# Patient Record
Sex: Female | Born: 1990 | Race: White | Hispanic: No | Marital: Single | Smoking: Never smoker
Health system: Southern US, Community
[De-identification: ages and names within clinical notes are randomized; demographics above are authoritative.]

## PROBLEM LIST (undated history)

## (undated) DIAGNOSIS — L509 Urticaria, unspecified: Secondary | ICD-10-CM

## (undated) DIAGNOSIS — L709 Acne, unspecified: Secondary | ICD-10-CM

## (undated) HISTORY — PX: TONSILLECTOMY: SHX5217

## (undated) HISTORY — DX: Acne, unspecified: L70.9

## (undated) HISTORY — DX: Urticaria, unspecified: L50.9

---
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
## 2011-02-01 NOTE — Op Note (Signed)
NAMEDESHONNA, TRNKA                              ACCOUNT NO.:  [id number]   MEDICAL RECORD NO.:  [id number]                   PATIENT TYPE:  AMB   LOCATION:  DSC                                  FACILITY:  [facility]   PHYSICIAN:  Christopher E. Ezzard Standing, M.D.         DATE OF BIRTH:  12-25-1990   DATE OF PROCEDURE:  08/05/2003  DATE OF DISCHARGE:                                 OPERATIVE REPORT   PREOPERATIVE DIAGNOSES:  Chronic tonsillitis with tonsillar hypertrophy.  Nasal obstruction with turbinate hypertrophy.   POSTOPERATIVE DIAGNOSES:  Chronic tonsillitis with tonsillar hypertrophy.  Nasal obstruction with turbinate hypertrophy.   OPERATION:  Tonsillectomy and adenoidectomy.  Bilateral inferior turbinate  reductions with submucosal coblation.   SURGEON:  Kristine Garbe. Ezzard Standing, M.D.   ANESTHESIA:  General endotracheal.   COMPLICATIONS:  None.   BRIEF CLINICAL NOTE:  Michelle Brown is an 21 year old who has had frequent upper  respiratory infections as well as chronic sore throats.  On examination, she  has moderately large embedded tonsils which are very cryptic bilaterally, 2-  3+.  On nasal exam she has large swollen inferior turbinates with swollen  nasal mucosa consistent with rhinitis and turbinate hypertrophy.  She was  taken to the operating room at this time for tonsillectomy, adenoidectomy,  and turbinate reductions.   DESCRIPTION OF PROCEDURE:  After adequate endotracheal anesthesia, the  patient received 8 mg of Decadron IV preoperatively as well as 1 g Ancef IV  preoperatively.  Mouth gag was used to expose the oropharynx.  Using the  coblator, the tonsils were resected from the tonsil fossae bilaterally.  She  had deep, embedded tonsil bilaterally.  Hemostasis was obtained with the  coblator.  Care was taken to preserve the uvula, anterior and posterior  tonsillar pillars.  Following this, a red rubber catheter was passed through  the nose and out the mouth to retract soft  palate and nasopharynx was  examined.  [name] had just small, average-sized adenoid tissue.  The minimal  amount of adenoid tissue was coblated.   Following this, the nose was examined.  [name] had large inferior turbinates  bilaterally.  Again using the coblator--the turbinates were injected with  Xylocaine with epinephrine--and the coblator was passed anteriorly with  coblation set at 5 for ten seconds and then a second posterior lesion set at  5 for ten seconds and then a third lesion anteriorly-superiorly again set at  5 for ten seconds.  The procedure was repeated on the right side, again  using coblator and producing submucosal coblation at three different sites  for ten seconds.   [name] tolerated this well and was subsequently awoken from anesthesia and  transferred to the recovery room postoperatively doing well.   DISPOSITION:  Michelle Brown will be discharged home later this morning on amoxicillin  suspension 400 mg b.i.d. for one week, Tylenol, Lortab elixir 10-15 cc q.5h.  p.r.n. pain.  Will have  her follow up in my office in two weeks for recheck.                                               Kristine Garbe. Ezzard Standing, M.D.    CEN/MEDQ  D:  08/05/2003  T:  08/06/2003  Job:  [id number]   cc:   Luz Brazen, M.D.

## 2011-04-24 ENCOUNTER — Telehealth: Payer: Self-pay | Admitting: Family Medicine

## 2011-04-24 NOTE — Telephone Encounter (Signed)
My schedule is beyond full and I need to be somewhere at 1p--- I can see her tomorrow---or if it is more urgent than that --- we can scheule her with another Dr.

## 2011-04-24 NOTE — Telephone Encounter (Signed)
Spoke with Pt mom appt scheduled.

## 2011-04-25 ENCOUNTER — Encounter: Payer: Self-pay | Admitting: Family Medicine

## 2011-04-25 ENCOUNTER — Ambulatory Visit (INDEPENDENT_AMBULATORY_CARE_PROVIDER_SITE_OTHER): Payer: BC Managed Care – PPO | Admitting: Family Medicine

## 2011-04-25 VITALS — BP 90/60 | HR 88 | Temp 98.4°F | Wt 164.0 lb

## 2011-04-25 DIAGNOSIS — F988 Other specified behavioral and emotional disorders with onset usually occurring in childhood and adolescence: Secondary | ICD-10-CM

## 2011-04-25 DIAGNOSIS — R42 Dizziness and giddiness: Secondary | ICD-10-CM

## 2011-04-25 DIAGNOSIS — J329 Chronic sinusitis, unspecified: Secondary | ICD-10-CM

## 2011-04-25 DIAGNOSIS — D649 Anemia, unspecified: Secondary | ICD-10-CM

## 2011-04-25 LAB — HEPATIC FUNCTION PANEL
Albumin: 4.4 g/dL (ref 3.5–5.2)
Bilirubin, Direct: 0.1 mg/dL (ref 0.0–0.3)
Total Bilirubin: 0.8 mg/dL (ref 0.3–1.2)
Total Protein: 7.4 g/dL (ref 6.0–8.3)

## 2011-04-25 LAB — CBC WITH DIFFERENTIAL/PLATELET
Eosinophils Absolute: 0.1 10*3/uL (ref 0.0–0.7)
Eosinophils Relative: 2.4 % (ref 0.0–5.0)
HCT: 39.1 % (ref 36.0–46.0)
Hemoglobin: 13.1 g/dL (ref 12.0–15.0)
Lymphs Abs: 1.6 10*3/uL (ref 0.7–4.0)
MCHC: 33.4 g/dL (ref 30.0–36.0)
MCV: 90 fl (ref 78.0–100.0)
Neutro Abs: 2 10*3/uL (ref 1.4–7.7)
Neutrophils Relative %: 49.9 % (ref 43.0–77.0)
RBC: 4.35 Mil/uL (ref 3.87–5.11)

## 2011-04-25 LAB — IBC PANEL: Transferrin: 301.3 mg/dL (ref 212.0–360.0)

## 2011-04-25 LAB — BASIC METABOLIC PANEL
CO2: 27 mEq/L (ref 19–32)
Chloride: 102 mEq/L (ref 96–112)
GFR: 99.05 mL/min (ref >60.00)
Glucose, Bld: 92 mg/dL (ref 70–99)

## 2011-04-25 LAB — FERRITIN: Ferritin: 17.9 ng/mL (ref 10.0–291.0)

## 2011-04-25 MED ORDER — CEFUROXIME AXETIL 500 MG PO TABS: 500.0000 mg | ORAL_TABLET | Freq: Two times a day (BID) | ORAL | Status: AC

## 2011-04-25 MED ORDER — METHYLPHENIDATE HCL 20 MG PO TABS: ORAL_TABLET | ORAL | Status: DC

## 2011-04-25 NOTE — Patient Instructions (Signed)

## 2011-04-25 NOTE — Progress Notes (Signed)
Subjective:    Patient ID: Michelle Brown, female    DOB: 1991-08-15, 20 y.o.   MRN: [id number]  HPI    Review of Systems     Objective:   Physical Exam        Assessment & Plan:   Subjective:    Michelle Brown is a 20 y.o. female who presents for evaluation of headache. Symptoms began about 6 days ago. Generally, the headaches last about all day for 6 days and occur continuously. The headaches are better if lying down and worse during day. The headaches are usually throbbing and are located in front of head and neck.  The patient rates her most severe headaches a 4 on a scale from 1 to 10. Recently, the headaches have been stable. Work attendance or other daily activities are not affected by the headaches. Precipitating factors include: none which have been determined. The headaches are usually not preceded by an aura. Associated neurologic symptoms: decreased physical activity, dizziness and vision problems. The patient denies depression, loss of balance, muscle weakness, numbness of extremities, speech difficulties, vomiting in the early morning and worsening school/work performance. Home treatment has included ibuprofen, resting and sleeping with no improvement. Other history includes:   The following portions of the patient's history were reviewed and updated as appropriate: allergies, current medications, past family history, past medical history, past social history, past surgical history and problem list.  Review of Systems Pertinent items are noted in HPI.    Objective:    BP 90/60  Pulse 88  Temp(Src) 98.4 F (36.9 C) (Oral)  Wt 164 lb (74.39 kg)  SpO2 97% General appearance: alert, cooperative, appears stated age and mildly obese Ears: normal TM's and external ear canals both ears Nose: Nares normal. Septum midline. Mucosa normal. No drainage or sinus tenderness., green discharge, mild congestion, turbinates red, swollen, sinus tenderness bilateral Throat: lips, mucosa, and  tongue normal; teeth and gums normal Lungs: clear to auscultation bilaterally Heart: regular rate and rhythm, S1, S2 normal, no murmur, click, rub or gallop Neurologic: Grossly normal   Assessment:        Plan:     Subjective:     Michelle Brown is a 20 y.o. female who presents for evaluation of sinus pain. Symptoms include: congestion, headaches, nasal congestion and post nasal drip. Onset of symptoms was 5 days ago. Symptoms have been unchanged since that time. Past history is significant for no history of pneumonia or bronchitis. Patient is a non-smoker.  The following portions of the patient's history were reviewed and updated as appropriate: allergies, current medications, past family history, past medical history, past social history, past surgical history and problem list.  Review of Systems Pertinent items are noted in HPI.   Objective:    BP 90/60  Pulse 88  Temp(Src) 98.4 F (36.9 C) (Oral)  Wt 164 lb (74.39 kg)  SpO2 97% General appearance: alert, cooperative, appears stated age and no distress Ears: normal TM's and external ear canals both ears Nose: Nares normal. Septum midline. Mucosa normal. No drainage or sinus tenderness., mild congestion, sinus tenderness bilateral Throat: lips, mucosa, and tongue normal; teeth and gums normal Neck: no adenopathy, no carotid bruit, no JVD, supple, symmetrical, trachea midline and thyroid not enlarged, symmetric, no tenderness/mass/nodules Lungs: clear to auscultation bilaterally Heart: regular rate and rhythm, S1, S2 normal, no murmur, click, rub or gallop Extremities: extremities normal, atraumatic, no cyanosis or edema    Assessment:    Acute bacterial sinusitis.  Plan:    Nasal steroids per medication orders. Ceftin per medication orders. Follow up in 2 days or as needed.

## 2011-04-26 ENCOUNTER — Telehealth: Payer: Self-pay | Admitting: *Deleted

## 2011-04-26 NOTE — Telephone Encounter (Signed)
Discuss with patient  

## 2011-04-26 NOTE — Telephone Encounter (Signed)
Pt is not anemic Rest of labs normal Left message for pt to call back.

## 2011-04-26 NOTE — Telephone Encounter (Signed)
Error

## 2011-08-12 ENCOUNTER — Encounter: Payer: Self-pay | Admitting: Family

## 2011-08-12 ENCOUNTER — Ambulatory Visit (INDEPENDENT_AMBULATORY_CARE_PROVIDER_SITE_OTHER): Payer: BC Managed Care – PPO | Admitting: Family

## 2011-08-12 VITALS — BP 108/72 | HR 82 | Temp 98.2°F | Resp 16 | Wt 154.1 lb

## 2011-08-12 DIAGNOSIS — J019 Acute sinusitis, unspecified: Secondary | ICD-10-CM

## 2011-08-12 DIAGNOSIS — J029 Acute pharyngitis, unspecified: Secondary | ICD-10-CM

## 2011-08-12 MED ORDER — AMOXICILLIN-POT CLAVULANATE 875-125 MG PO TABS: 1.0000 | ORAL_TABLET | Freq: Two times a day (BID) | ORAL | Status: AC

## 2011-08-12 NOTE — Patient Instructions (Signed)
Please complete your lab work prior to leaving today. Use back up birth control while on Augmentin and for 1-2 weeks after.  Call if symptoms worsen or if you are not feeling better in 2-3 days.

## 2011-08-12 NOTE — Progress Notes (Signed)
  Subjective:    Patient ID: Michelle Brown, female    DOB: 08-19-1991, 20 y.o.   MRN: [id number]  HPI  Ms.  Michelle Brown is a 20 yr old female who presents today with chief complaint of sinus congestion and headache.  She reports associated green nasal discharge. She has associated sore throat.  She notes occasional green discharge.  Symptoms started 2 weeks ago and are waxing and waning in severity.  Has associated headache. She has tried tyleonl, advil and sudafed without improvement.   Review of Systems    see HPI  Past Medical History  Diagnosis Date  . Acne     History   Social History  . Marital Status: Single    Spouse Name: N/A    Number of Children: N/A  . Years of Education: N/A   Occupational History  . Not on file.   Social History Main Topics  . Smoking status: Never Smoker   . Smokeless tobacco: Not on file  . Alcohol Use: No  . Drug Use: No  . Sexually Active: Not on file   Other Topics Concern  . Not on file   Social History Narrative  . No narrative on file    Past Surgical History  Procedure Date  . Tonsillectomy     Family History  Problem Relation Age of Onset  . Hypertension    . Leukemia    . Pancreatic cancer      No Known Allergies  Current Outpatient Prescriptions on File Prior to Visit  Medication Sig Dispense Refill  . METRONIDAZOLE, TOPICAL, 0.75 % LOTN       . Norethin Ace-Eth Estrad-FE (LOESTRIN 24 FE PO) Take 1 tablet by mouth daily.          BP 108/72  Pulse 82  Temp(Src) 98.2 F (36.8 C) (Oral)  Resp 16  Wt 154 lb 1.3 oz (69.89 kg)  SpO2 98%  LMP 06/17/2011    Objective:   Physical Exam  Constitutional: She appears well-developed and well-nourished.  HENT:  Head: Normocephalic and atraumatic.  Right Ear: Tympanic membrane and ear canal normal.  Left Ear: Tympanic membrane and ear canal normal.  Mouth/Throat: Uvula is midline and mucous membranes are normal. No oropharyngeal exudate, posterior oropharyngeal edema or  posterior oropharyngeal erythema.       Mild maxillary and sinus tenderness to palpation.    Eyes: No scleral icterus.  Abdominal: Soft. Bowel sounds are normal.       No splenomegaly noted.           Assessment & Plan:

## 2011-08-12 NOTE — Assessment & Plan Note (Signed)
Will treat with augmentin- pt advised re: interaction with OCP and decreased effectiveness (recommended use of condoms).  Given duration of sore throat and known exposure to mono (friend) will also check monospot.

## 2011-08-13 ENCOUNTER — Encounter: Payer: Self-pay | Admitting: Family

## 2011-08-13 ENCOUNTER — Other Ambulatory Visit: Payer: Self-pay

## 2011-08-13 LAB — MONONUCLEOSIS SCREEN: Mono Screen: NEGATIVE

## 2011-08-13 MED ORDER — METHYLPHENIDATE HCL 20 MG PO TABS: 20.0000 mg | ORAL_TABLET | Freq: Two times a day (BID) | ORAL | Status: DC

## 2011-08-13 NOTE — Telephone Encounter (Signed)
90 supply sent to mail order and 30 supply printed for pick up---VM left making patient aware    KP

## 2011-08-22 ENCOUNTER — Ambulatory Visit: Payer: BC Managed Care – PPO | Admitting: Family Medicine

## 2011-08-27 ENCOUNTER — Ambulatory Visit (INDEPENDENT_AMBULATORY_CARE_PROVIDER_SITE_OTHER): Payer: BC Managed Care – PPO | Admitting: Family Medicine

## 2011-08-27 ENCOUNTER — Encounter: Payer: Self-pay | Admitting: Family Medicine

## 2011-08-27 VITALS — BP 115/70 | HR 64 | Temp 97.9°F | Ht 72.0 in | Wt 155.4 lb

## 2011-08-27 DIAGNOSIS — R5383 Other fatigue: Secondary | ICD-10-CM

## 2011-08-27 DIAGNOSIS — R5381 Other malaise: Secondary | ICD-10-CM

## 2011-08-27 DIAGNOSIS — R319 Hematuria, unspecified: Secondary | ICD-10-CM

## 2011-08-27 LAB — POCT URINALYSIS DIPSTICK
Bilirubin, UA: NEGATIVE
Glucose, UA: NEGATIVE
Ketones, UA: NEGATIVE
pH, UA: 7

## 2011-08-27 LAB — CBC WITH DIFFERENTIAL/PLATELET
Eosinophils Absolute: 0.1 10*3/uL (ref 0.0–0.7)
Hemoglobin: 12.8 g/dL (ref 12.0–15.0)
Lymphocytes Relative: 35.2 % (ref 12.0–46.0)
MCHC: 33.9 g/dL (ref 30.0–36.0)
MCV: 91.6 fl (ref 78.0–100.0)
Monocytes Absolute: 0.2 10*3/uL (ref 0.1–1.0)
Monocytes Relative: 6.3 % (ref 3.0–12.0)
Platelets: 154 10*3/uL (ref 150.0–400.0)
RDW: 12.9 % (ref 11.5–14.6)

## 2011-08-27 LAB — BASIC METABOLIC PANEL
BUN: 12 mg/dL (ref 6–23)
Calcium: 9.2 mg/dL (ref 8.4–10.5)
Potassium: 3.7 mEq/L (ref 3.5–5.1)

## 2011-08-27 LAB — T3, FREE: T3, Free: 2.8 pg/mL (ref 2.3–4.2)

## 2011-08-27 LAB — HEPATIC FUNCTION PANEL
AST: 23 U/L (ref 0–37)
Albumin: 4.1 g/dL (ref 3.5–5.2)
Total Protein: 7.3 g/dL (ref 6.0–8.3)

## 2011-08-27 NOTE — Progress Notes (Signed)
Addended by: Legrand Como on: 08/27/2011 11:40 AM   Modules accepted: Orders

## 2011-08-27 NOTE — Patient Instructions (Signed)

## 2011-08-27 NOTE — Progress Notes (Signed)
  Subjective:     Michelle Brown is a 20 y.o. female who presents for evaluation of fatigue. Symptoms began 3 months ago. The patient feels the fatigue began with: cold intolerance, change in hair texture, exercise intolerance and irregular periods.  Last one was heavy.. Symptoms of her fatigue have been headaches and poor athletic performance. Patient describes the following psychological symptoms: none. Patient denies fever, GI blood loss, symptoms of arthritis, unusual rashes and witnessed or suspected sleep apnea. Symptoms have gradually worsened. Symptom severity: struggles to carry out day to day responsibilities.. Previous visits for this problem: none.   The following portions of the patient's history were reviewed and updated as appropriate: allergies, current medications, past family history, past medical history, past social history, past surgical history and problem list.  Review of Systems Pertinent items are noted in HPI.    Objective:    BP 115/70  Pulse 64  Temp(Src) 97.9 F (36.6 C) (Oral)  Ht 6' (1.829 m)  Wt 155 lb 6.4 oz (70.489 kg)  BMI 21.08 kg/m2  SpO2 98%  LMP 08/20/2011 General appearance: alert, cooperative, appears stated age and no distress Eyes: conjunctivae/corneas clear. PERRL, EOM's intact. Fundi benign. Ears: normal TM's and external ear canals both ears Nose: Nares normal. Septum midline. Mucosa normal. No drainage or sinus tenderness. Throat: lips, mucosa, and tongue normal; teeth and gums normal Neck: no adenopathy, supple, symmetrical, trachea midline and thyroid not enlarged, symmetric, no tenderness/mass/nodules Lungs: clear to auscultation bilaterally Heart: regular rate and rhythm, S1, S2 normal, no murmur, click, rub or gallop Extremities: extremities normal, atraumatic, no cyanosis or edema Skin: Skin color, texture, turgor normal. No rashes or lesions Lymph nodes: Cervical, supraclavicular, and axillary nodes normal.    Assessment:    fatigue--etiology unknown    Plan:    Discussed diagnosis with patient. Discussed lifestyle modification as means of resolving problem. See orders for lab evaluation. Follow up in 1 year or as needed.

## 2011-08-28 LAB — EPSTEIN-BARR VIRUS VCA ANTIBODY PANEL
EBV EA IgG: 0.32 {ISR}
EBV NA IgG: 0.11 {ISR}
EBV VCA IgG: 0.11 {ISR}
EBV VCA IgM: 0.27 {ISR}

## 2011-08-30 LAB — URINE CULTURE: Colony Count: 15000

## 2011-12-24 ENCOUNTER — Other Ambulatory Visit: Payer: Self-pay | Admitting: Family Medicine

## 2011-12-24 ENCOUNTER — Other Ambulatory Visit: Payer: Self-pay

## 2011-12-24 DIAGNOSIS — F988 Other specified behavioral and emotional disorders with onset usually occurring in childhood and adolescence: Secondary | ICD-10-CM

## 2011-12-24 MED ORDER — METHYLPHENIDATE HCL 20 MG PO TABS: 20.0000 mg | ORAL_TABLET | Freq: Two times a day (BID) | ORAL | Status: DC

## 2011-12-24 NOTE — Telephone Encounter (Signed)
April, May and June 2013 Printed      KP

## 2011-12-24 NOTE — Telephone Encounter (Signed)
Addended by: Arnette Norris on: 12/24/2011 12:04 PM   Modules accepted: Orders

## 2011-12-24 NOTE — Telephone Encounter (Signed)
Correction, reprinted Rx for quantity 180 so the patient can send to the mail order pharmacy, Dr.Lowne printed her a 30 day supply as well to take to the local pharmacy since the patient is completely out, all other Rx has been discarded.    KP

## 2012-02-19 ENCOUNTER — Telehealth: Payer: Self-pay

## 2012-02-19 ENCOUNTER — Other Ambulatory Visit: Payer: Self-pay | Admitting: Family Medicine

## 2012-02-19 NOTE — Telephone Encounter (Signed)
What kind of laser surgery is she having?

## 2012-02-19 NOTE — Telephone Encounter (Signed)
Patient says the cream is called Benzolidocaine-tetracine 6% laser hair removal surgery.    KP

## 2012-02-19 NOTE — Telephone Encounter (Signed)
Call from patient requesting a numbing cream for laser surgery, she stated the cream was a 6% BLP cream. Please advise    KP

## 2012-02-19 NOTE — Telephone Encounter (Signed)
Ok to call in 1 tube of gel --use as directed----no refills

## 2012-02-20 MED ORDER — AMBULATORY NON FORMULARY MEDICATION: Status: DC

## 2012-02-20 NOTE — Telephone Encounter (Signed)
Call from [facility] and the can not fill this medication. I made patient aware and advised I would send to [facility], she voiced understanding.... Rx faxed     KP

## 2012-02-20 NOTE — Telephone Encounter (Signed)
printed as a non formulary Rx and faxed to [facility].      KP

## 2012-04-23 IMAGING — US US EXTREM LOW VENOUS*L*
1 series · 14 of 23 positions shown · non-contrast
Comparison: None.

CLINICAL DATA: Left calf pain.  Evaluate for DVT.

LEFT LOWER EXTREMITY VENOUS DUPLEX ULTRASOUND
TECHNIQUE: Gray-scale sonography with graded compression, as well
as color Doppler and duplex ultrasound were performed to evaluate
the deep venous system of the lower extremity from the level of the
common femoral vein through the popliteal and proximal calf veins.
Spectral Doppler was utilized to evaluate flow at rest and with
distal augmentation maneuvers.

[Series 1: us extrem low venous*left* · 14 of 23 slices shown]
[im 1/23]
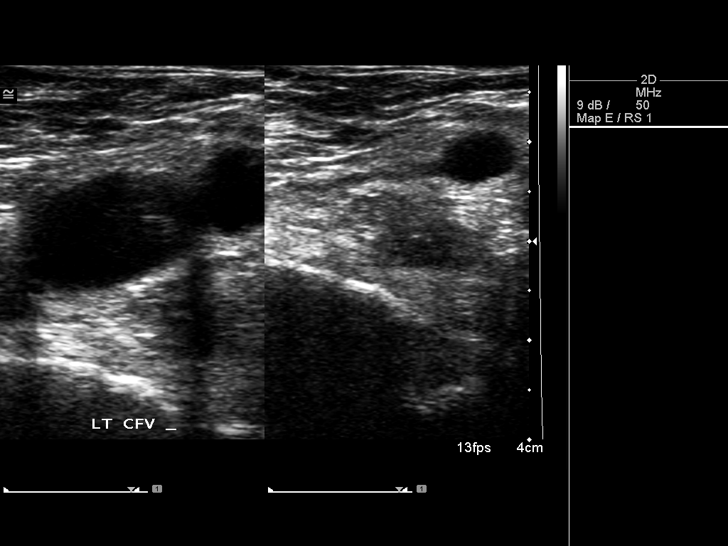
[im 3/23]
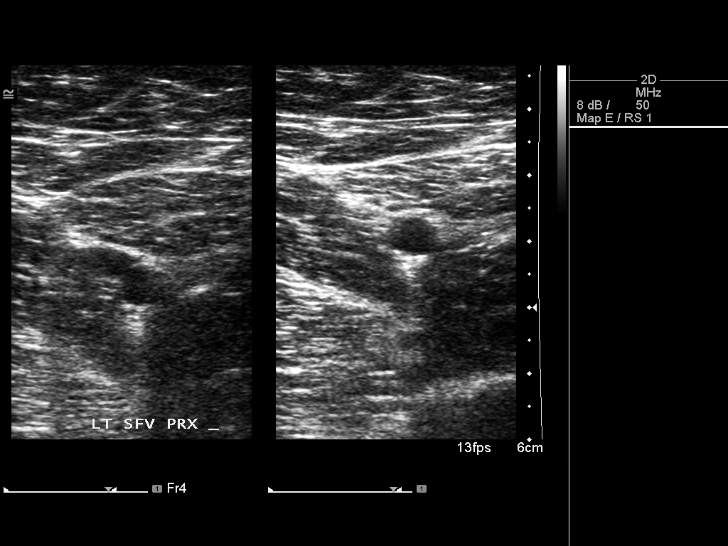
[im 5/23]
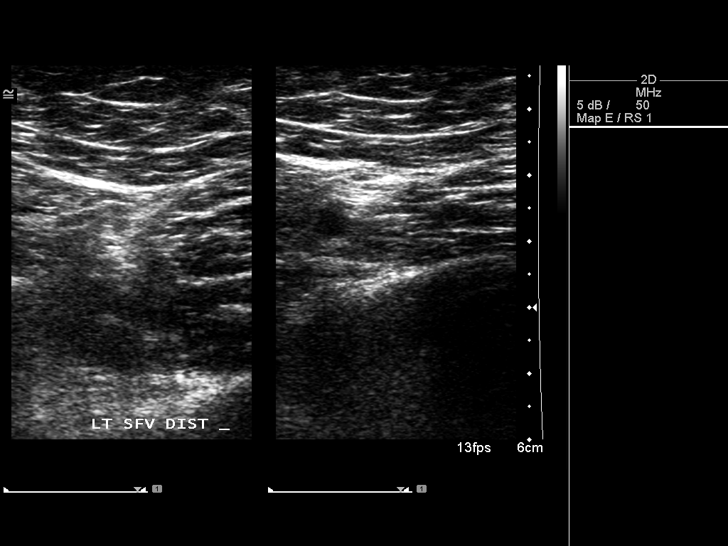
[im 6/23]
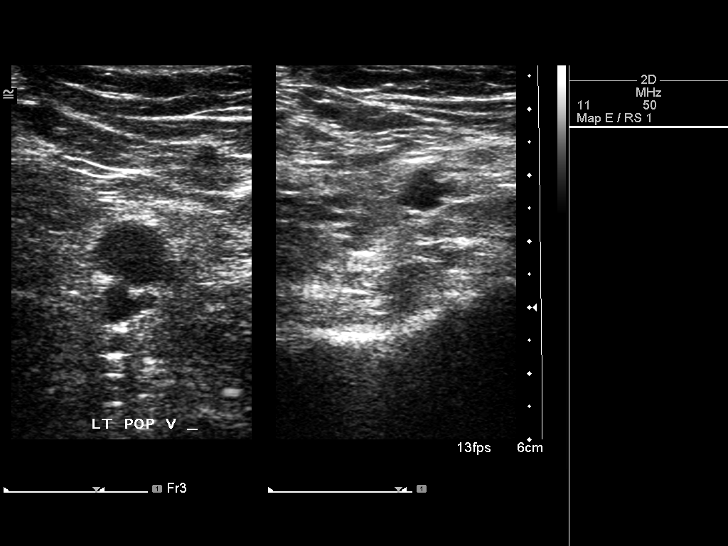
[im 8/23]
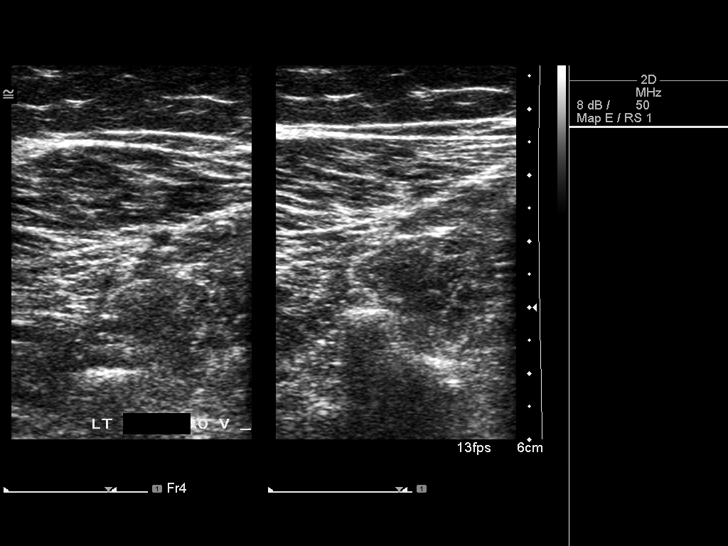
[im 10/23]
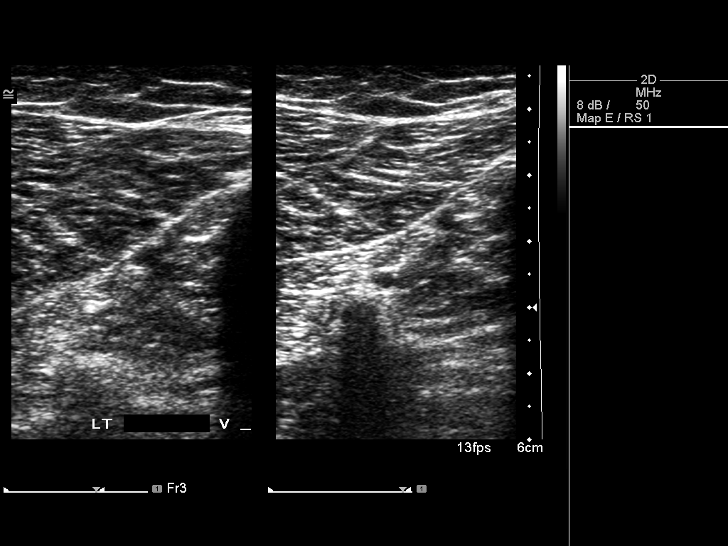
[im 11/23]
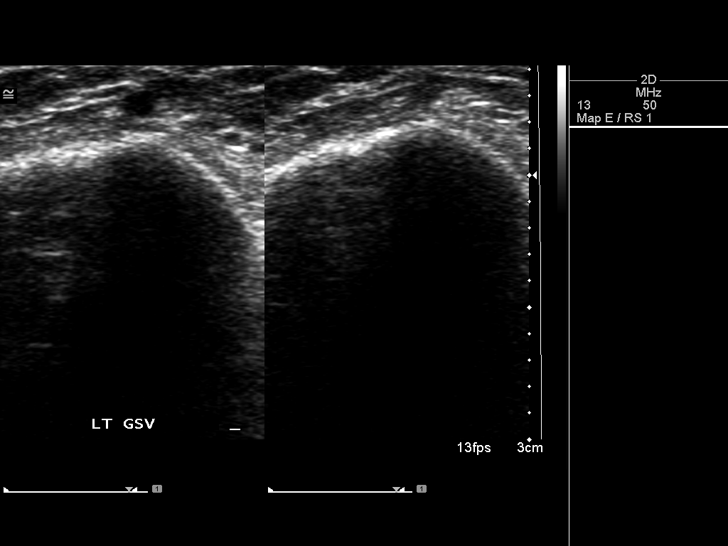
[im 13/23]
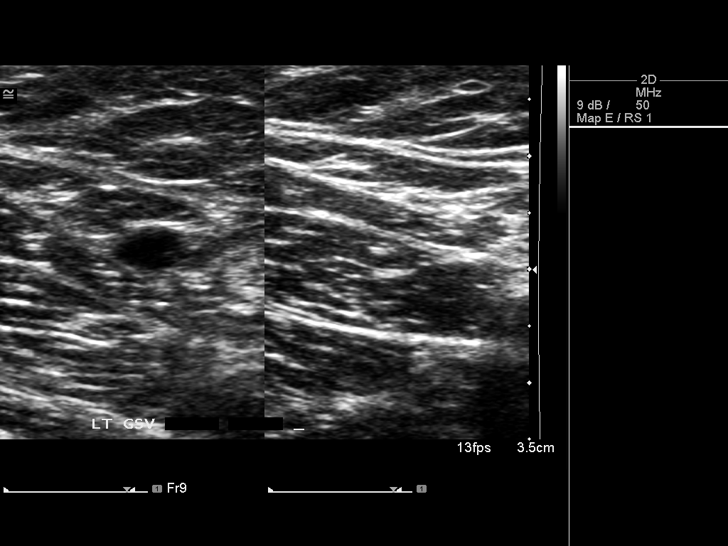
[im 14/23]
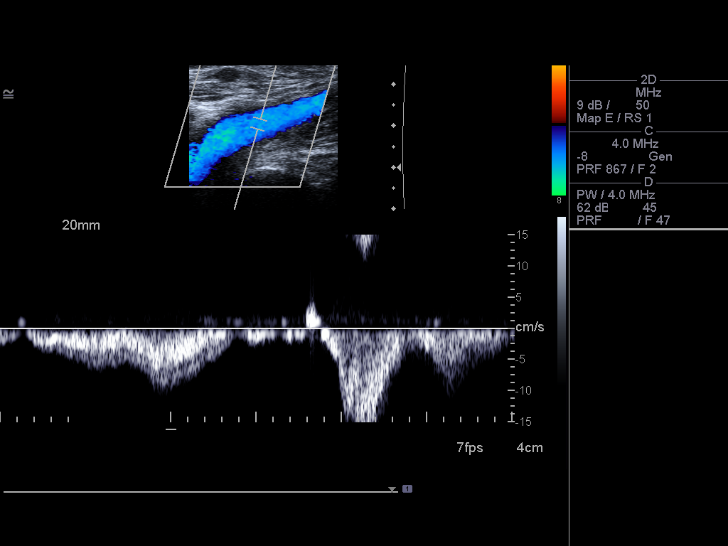
[im 16/23]
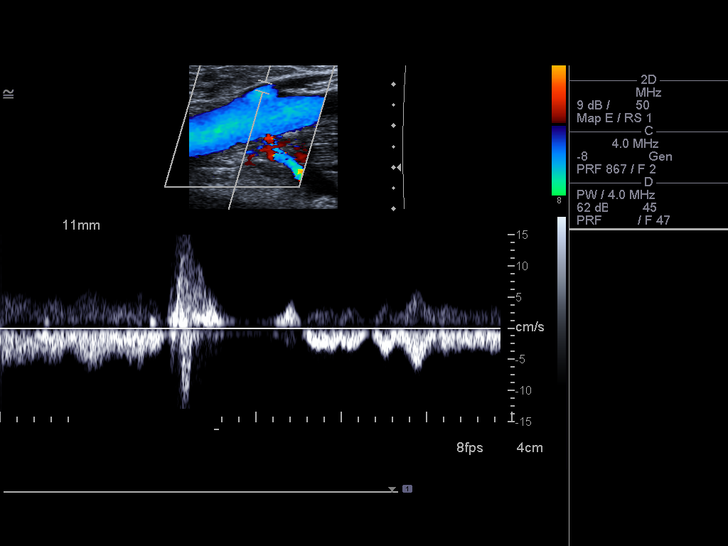
[im 18/23]
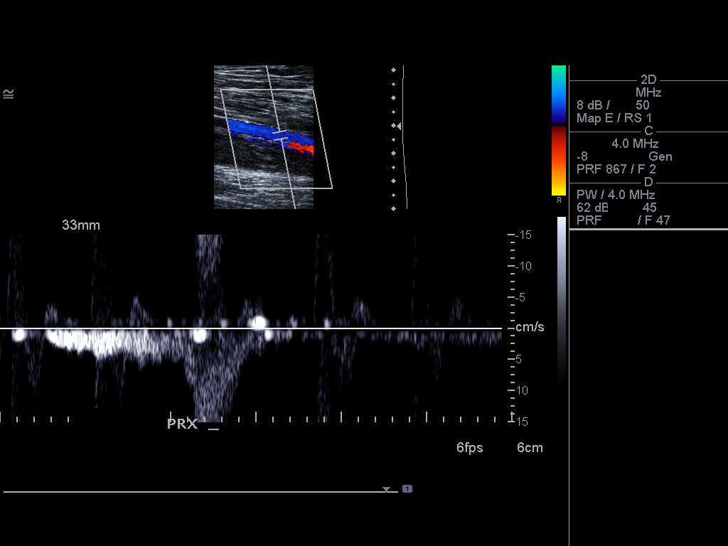
[im 19/23]
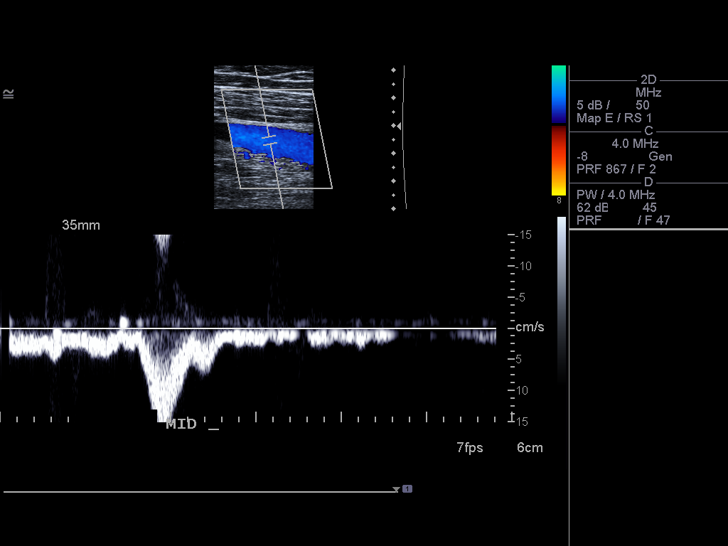
[im 21/23]
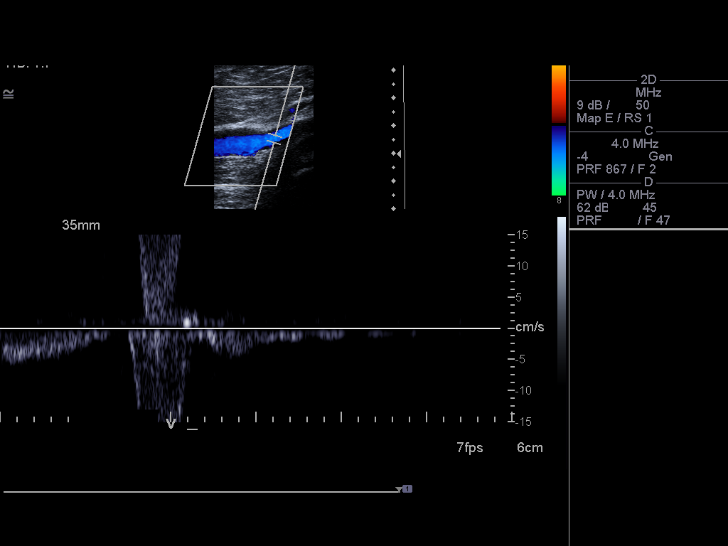
[im 23/23]
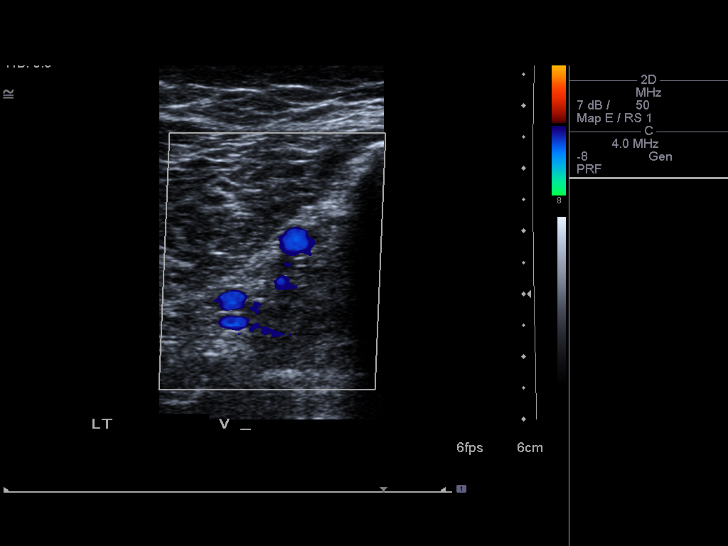

[14 of 23 positions shown; findings below may reference images not displayed]

FINDINGS: Normal compressibility of the common femoral,
superficial femoral, and popliteal veins is demonstrated, as well
as the visualized proximal calf veins.  No filling defects to
suggest DVT on grayscale or color Doppler imaging.  Doppler
waveforms show normal direction of venous flow, normal respiratory
phasicity and response to augmentation.
IMPRESSION: No evidence of left lower extremity deep vein thrombosis.

## 2012-04-29 ENCOUNTER — Other Ambulatory Visit: Payer: Self-pay | Admitting: Family Medicine

## 2012-04-29 DIAGNOSIS — F988 Other specified behavioral and emotional disorders with onset usually occurring in childhood and adolescence: Secondary | ICD-10-CM

## 2012-04-29 MED ORDER — METHYLPHENIDATE HCL 20 MG PO TABS: 20.0000 mg | ORAL_TABLET | Freq: Two times a day (BID) | ORAL | Status: DC

## 2012-04-29 NOTE — Telephone Encounter (Signed)
Anne aware Rx ready for pick up.     KP

## 2012-04-29 NOTE — Telephone Encounter (Signed)
Pts. Mom called (Is on DPR) made CPE for 12.18.13 at 10:30am, STATED she to needs refill on Ritlan  Last wrt 4.9.13 #180 ITALIN 20 MG Take 1 tablet (20 mg total) by mouth 2 (two) times daily. Last ov 12.11.12-  Acute visit for fatigue  Moms CB# [phone] (ANNE)

## 2012-08-27 ENCOUNTER — Encounter: Payer: Self-pay | Admitting: Family Medicine

## 2012-08-27 ENCOUNTER — Ambulatory Visit (INDEPENDENT_AMBULATORY_CARE_PROVIDER_SITE_OTHER): Payer: BC Managed Care – PPO | Admitting: Family Medicine

## 2012-08-27 VITALS — BP 108/66 | HR 57 | Temp 98.4°F | Wt 166.2 lb

## 2012-08-27 DIAGNOSIS — R200 Anesthesia of skin: Secondary | ICD-10-CM

## 2012-08-27 DIAGNOSIS — F988 Other specified behavioral and emotional disorders with onset usually occurring in childhood and adolescence: Secondary | ICD-10-CM

## 2012-08-27 DIAGNOSIS — R202 Paresthesia of skin: Secondary | ICD-10-CM

## 2012-08-27 DIAGNOSIS — R58 Hemorrhage, not elsewhere classified: Secondary | ICD-10-CM

## 2012-08-27 DIAGNOSIS — R209 Unspecified disturbances of skin sensation: Secondary | ICD-10-CM

## 2012-08-27 DIAGNOSIS — I998 Other disorder of circulatory system: Secondary | ICD-10-CM

## 2012-08-27 MED ORDER — METHYLPHENIDATE HCL 20 MG PO TABS: 20.0000 mg | ORAL_TABLET | Freq: Two times a day (BID) | ORAL | Status: DC

## 2012-08-27 NOTE — Assessment & Plan Note (Signed)
Refill meds rto 6 months 

## 2012-08-27 NOTE — Progress Notes (Signed)
  Subjective:    Patient ID: Michelle Brown, female    DOB: May 13, 1991, 21 y.o.   MRN: [id number]  HPI Pt here c/o numbness in legs and feet that comes and goes x 1 year.   + bruising as well.  No injury. Occurs mostly with sitting but can occur with standing as well.  No other complaints. She is also her f/u add-- no complaints.   Review of Systems As above    Objective:   Physical Exam  BP 108/66  Pulse 57  Temp 98.4 F (36.9 C) (Oral)  Wt 166 lb 3.2 oz (75.388 kg)  SpO2 97% General appearance: alert, cooperative, appears stated age and no distress Neck: no adenopathy, supple, symmetrical, trachea midline and thyroid not enlarged, symmetric, no tenderness/mass/nodules Lungs: clear to auscultation bilaterally Heart: S1, S2 normal Extremities: extremities normal, atraumatic, no cyanosis or edema Neurologic: Alert and oriented X 3, normal strength and tone. Normal symmetric reflexes. Normal coordination and gait Sensory: normal Normal pulses Skin-- no bruising      Assessment & Plan:

## 2012-08-27 NOTE — Assessment & Plan Note (Signed)
Refer to neurology Check labs

## 2012-08-28 LAB — HEPATIC FUNCTION PANEL
AST: 20 U/L (ref 0–37)
Alkaline Phosphatase: 47 U/L (ref 39–117)
Bilirubin, Direct: 0.1 mg/dL (ref 0.0–0.3)
Total Bilirubin: 0.9 mg/dL (ref 0.3–1.2)

## 2012-08-28 LAB — CBC WITH DIFFERENTIAL/PLATELET
Basophils Absolute: 0 10*3/uL (ref 0.0–0.1)
Hemoglobin: 12.5 g/dL (ref 12.0–15.0)
Lymphocytes Relative: 34.5 % (ref 12.0–46.0)
Monocytes Relative: 5.9 % (ref 3.0–12.0)
Neutro Abs: 2.8 10*3/uL (ref 1.4–7.7)
Platelets: 209 10*3/uL (ref 150.0–400.0)
RDW: 14.7 % — ABNORMAL HIGH (ref 11.5–14.6)

## 2012-08-28 LAB — PROTIME-INR: INR: 1.1 ratio — ABNORMAL HIGH (ref 0.8–1.0)

## 2012-08-28 LAB — BASIC METABOLIC PANEL
Calcium: 9.3 mg/dL (ref 8.4–10.5)
GFR: 110.54 mL/min (ref >60.00)
Glucose, Bld: 98 mg/dL (ref 70–99)
Sodium: 136 mEq/L (ref 135–145)

## 2012-09-04 ENCOUNTER — Encounter: Payer: BC Managed Care – PPO | Admitting: Family Medicine

## 2012-09-08 ENCOUNTER — Telehealth: Payer: Self-pay

## 2012-09-08 NOTE — Telephone Encounter (Signed)
Called patient on cell and home phone, call would not go through on either number (continual beeping sound).  I tried calling again, after a long pause I was able to leave message on VM (home number), I tried cell phone again (unable to leave message -continual beeping)

## 2012-09-08 NOTE — Telephone Encounter (Signed)
Message copied by Maurice Small on Tue Sep 08, 2012  1:40 PM ------      Message from: Lelon Perla      Created: Thu Sep 03, 2012  3:19 PM       Pt taking aspirin or other otc med or herbal med?

## 2012-09-14 NOTE — Telephone Encounter (Signed)
Pt states that the she taking Asprin frequently, melatonin and calm sleep meds along a lot herbal med.

## 2012-09-14 NOTE — Telephone Encounter (Signed)
That is probably why she is bruising--- stop all for 2 weeks and we should repeat pt/ptt , cbc then.

## 2012-09-14 NOTE — Telephone Encounter (Signed)
Spoke with patient and she voiced understanding, she will have the labs redrawn at [facility], she will call us if she needs any orders or if they need any information from [place].      KP

## 2012-11-12 ENCOUNTER — Encounter: Payer: Self-pay | Admitting: Family Medicine

## 2013-01-24 ENCOUNTER — Emergency Department (HOSPITAL_COMMUNITY)
Admission: EM | Admit: 2013-01-24 | Discharge: 2013-01-24 | Disposition: A | Discharge status: Home / Self Care | Payer: BC Managed Care – PPO | Attending: Emergency Medicine | Admitting: Emergency Medicine

## 2013-01-24 ENCOUNTER — Encounter (HOSPITAL_COMMUNITY): Payer: Self-pay | Admitting: Emergency Medicine

## 2013-01-24 DIAGNOSIS — L0390 Cellulitis, unspecified: Secondary | ICD-10-CM

## 2013-01-24 DIAGNOSIS — F909 Attention-deficit hyperactivity disorder, unspecified type: Secondary | ICD-10-CM | POA: Insufficient documentation

## 2013-01-24 DIAGNOSIS — Z79899 Other long term (current) drug therapy: Secondary | ICD-10-CM | POA: Insufficient documentation

## 2013-01-24 DIAGNOSIS — L0201 Cutaneous abscess of face: Secondary | ICD-10-CM | POA: Insufficient documentation

## 2013-01-24 DIAGNOSIS — F411 Generalized anxiety disorder: Secondary | ICD-10-CM | POA: Insufficient documentation

## 2013-01-24 DIAGNOSIS — Z872 Personal history of diseases of the skin and subcutaneous tissue: Secondary | ICD-10-CM | POA: Insufficient documentation

## 2013-01-24 DIAGNOSIS — L03211 Cellulitis of face: Secondary | ICD-10-CM | POA: Insufficient documentation

## 2013-01-24 MED ORDER — HYDROCODONE-ACETAMINOPHEN 5-325 MG PO TABS: 1.0000 | ORAL_TABLET | ORAL | Status: DC | PRN

## 2013-01-24 MED ORDER — LORAZEPAM 1 MG PO TABS
1.0000 mg | ORAL_TABLET | Freq: Once | ORAL | Status: AC
  Administered 2013-01-24: 1 mg via ORAL
  Filled 2013-01-24: qty 2

## 2013-01-24 MED ORDER — HYDROCODONE-ACETAMINOPHEN 5-325 MG PO TABS
1.0000 | ORAL_TABLET | Freq: Once | ORAL | Status: AC
  Administered 2013-01-24: 1 via ORAL
  Filled 2013-01-24: qty 1

## 2013-01-24 MED ORDER — HYDROCODONE-ACETAMINOPHEN 5-325 MG PO TABS: 2.0000 | ORAL_TABLET | Freq: Once | ORAL | Status: DC

## 2013-01-24 NOTE — ED Notes (Signed)
Pt discharged.Vital signs stable and GCS 15 

## 2013-01-24 NOTE — ED Provider Notes (Signed)
History    This chart was scribed for a non-physician practitioner, Dierdre Forth, PA-C, working with Loren Racer, MD by Frederik Pear, ED Scribe. This patient was seen in room TR08C/TR08C and the patient's care was started at 2125.   CSN: [id number]  Arrival date & time 01/24/13  2104   First MD Initiated Contact with Patient 01/24/13 2125      Chief Complaint  Patient presents with  . Facial Pain    (Consider location/radiation/quality/duration/timing/severity/associated sxs/prior treatment) Patient is a 22 y.o. female presenting with general illness. The history is provided by the patient. No language interpreter was used.  Illness  The current episode started today. The problem occurs continuously. The problem has been gradually worsening. Nothing relieves the symptoms. Nothing aggravates the symptoms. Pertinent negatives include no fever, no abdominal pain, no constipation, no diarrhea, no nausea, no vomiting, no headaches, no mouth sores, no cough, no wheezing and no rash.    HPI Comments: Michelle Brown is a 22 y.o. female with a h/o of acne who presents to the Emergency Department complaining of gradually worsening  constant, burning, severe left-sided facial pain near the bridge of the nose that she began this morning after she developed a cyst in the same location, and she mashed it in an attempt to pop it last night. She denies that there was a whitehead on the area when she squeezed it. She denies emesis, fever, or nausea. She reports that she was seen this morning at the [facility] ED in [place] where she was diagnosed with cellulitis, discharged with Naprosyn and Keflex, and told to return if symptoms worsened. She states that flew to [place] from [place], and while she was on the plane, the pain gradually worsened, and the area began to drain clear and slightly yellowish fluid.   In ED, she reports that the top of her skin is burning, but reports that the swelling and erythema has gradually  improved since this morning. She denies eye pain, but reports a mild, intermittent blurred vision. She states that she treated the symptoms with naproxen at 17:30. She has a h/o of ADHD and anxiety for which she takes Ritalin and Zoloft. She reports that she has an appointment with the dermatologist at 0745 tomorrow.   Past Medical History  Diagnosis Date  . Acne     Past Surgical History  Procedure Laterality Date  . Tonsillectomy      Family History  Problem Relation Age of Onset  . Hypertension    . Leukemia    . Pancreatic cancer      History  Substance Use Topics  . Smoking status: Never Smoker   . Smokeless tobacco: Not on file  . Alcohol Use: No    OB History   Grav Para Term Preterm Abortions TAB SAB Ect Mult Living                  Review of Systems  Constitutional: Negative for fever, diaphoresis, appetite change, fatigue and unexpected weight change.  HENT: Negative for mouth sores and neck stiffness.        Facial pain  Eyes: Negative for visual disturbance.  Respiratory: Negative for cough, chest tightness, shortness of breath and wheezing.   Cardiovascular: Negative for chest pain.  Gastrointestinal: Negative for nausea, vomiting, abdominal pain, diarrhea and constipation.  Endocrine: Negative for polydipsia, polyphagia and polyuria.  Genitourinary: Negative for dysuria, urgency, frequency and hematuria.  Musculoskeletal: Negative for back pain.  Skin: Negative for rash.  Allergic/Immunologic: Negative for immunocompromised state.  Neurological: Negative for syncope, light-headedness and headaches.  Hematological: Does not bruise/bleed easily.  Psychiatric/Behavioral: Negative for sleep disturbance. The patient is not nervous/anxious.     Allergies  Review of patient's allergies indicates no known allergies.  Home Medications   Current Outpatient Rx  Name  Route  Sig  Dispense  Refill  . cephALEXin (KEFLEX) 500 MG capsule   Oral   Take 500 mg  by mouth 4 (four) times daily. For 10 days starting 01/24/13         . methylphenidate (RITALIN) 20 MG tablet   Oral   Take 1 tablet (20 mg total) by mouth 2 (two) times daily.   180 tablet   0   . naproxen (EC NAPROSYN) 500 MG EC tablet   Oral   Take 500 mg by mouth 3 (three) times daily with meals.         . SOLODYN 80 MG TB24   Oral   Take 1 tablet by mouth daily.          Marland Kitchen HYDROcodone-acetaminophen (NORCO/VICODIN) 5-325 MG per tablet   Oral   Take 1 tablet by mouth every 4 (four) hours as needed for pain.   6 tablet   0     BP 134/74  Pulse 66  Temp(Src) 98.6 F (37 C) (Oral)  Resp 16  SpO2 100%  LMP 01/05/2013  Physical Exam  Nursing note and vitals reviewed. Constitutional: She appears well-developed and well-nourished. No distress.  HENT:  Head: Normocephalic and atraumatic.  Mouth/Throat: Oropharynx is clear and moist. No oropharyngeal exudate.  Eyes: Conjunctivae are normal. No scleral icterus.  Neck: Normal range of motion. Neck supple.  Cardiovascular: Normal rate, regular rhythm and intact distal pulses.   Pulmonary/Chest: Effort normal and breath sounds normal. No respiratory distress. She has no wheezes.  Abdominal: Soft. Bowel sounds are normal. She exhibits no mass. There is no tenderness. There is no rebound and no guarding.  Musculoskeletal: Normal range of motion. She exhibits no edema.  Neurological: She is alert.  Speech is clear and goal oriented Moves extremities without ataxia  Skin: Skin is warm and dry. Ecchymosis noted. She is not diaphoretic.  3x2 area of ecchymosis with no fluctuance and minimal induration to the left bridge of the nose draining serous fluid.  Psychiatric: She has a normal mood and affect.    ED Course  Procedures (including critical care time)  DIAGNOSTIC STUDIES: Oxygen Saturation is 100% on room air, normal by my interpretation.    COORDINATION OF CARE:  21:35- Discussed planned course of treatment with  the patient, including returning if her fever spikes higher than 103 or if she begins vomiting and keeping her appointment tomorrow with her dermatologist, who is agreeable at this time.  22:00- Medication Orders- lorazepam (ativan) tablet 1 mg- once, hydrocodone-acetaminophen (norco/vicodin) 5-325 mg per tablet 1 tablet-once.   Labs Reviewed - No data to display No results found.   1. Cellulitis       MDM  Michelle Brown presents with cellulitis < 12 hours after being evaluated in the [place] in [place].  Pt is without risk factors for HIV; no recent use of steroids or other immunosuppressive medications; no Hx of diabetes.  Pt is without gross abscess for which I&D would be possible.  Area marked and pt encouraged to return if redness begins to streak, extends beyond the markings, and/or fever or nausea/vomiting develop.  Pt is alert, oriented, NAD, afebrile,  non tachycardic, nonseptic and nontoxic appearing.  Pt to be d/c with oral antibiotics already rx and pain medications with strict f/u instructions with the dermatologist in the morning.    I personally performed the services described in this documentation, which was scribed in my presence. The recorded information has been reviewed and is accurate.         Dahlia Client Shannin Naab, PA-C 01/24/13 2202

## 2013-01-24 NOTE — ED Provider Notes (Signed)
Medical screening examination/treatment/procedure(s) were performed by non-physician practitioner and as supervising physician I was immediately available for consultation/collaboration.   Jasmen Emrich, MD 01/24/13 2238 

## 2013-01-24 NOTE — ED Notes (Signed)
Patient home from school, was seen in ED last night at [facility] in [place], patient popped cyst on left side of nose, now with increase pain and drainage.

## 2013-01-24 NOTE — ED Notes (Signed)
Wound marked by PA and cleaned.

## 2013-05-19 ENCOUNTER — Ambulatory Visit (INDEPENDENT_AMBULATORY_CARE_PROVIDER_SITE_OTHER): Payer: BC Managed Care – PPO | Admitting: Family Medicine

## 2013-05-19 ENCOUNTER — Encounter: Payer: Self-pay | Admitting: Family Medicine

## 2013-05-19 VITALS — BP 120/72 | HR 87 | Temp 98.4°F | Wt 179.0 lb

## 2013-05-19 DIAGNOSIS — R5381 Other malaise: Secondary | ICD-10-CM

## 2013-05-19 LAB — HEPATIC FUNCTION PANEL
ALT: 15 U/L (ref 0–35)
Total Bilirubin: 0.9 mg/dL (ref 0.3–1.2)
Total Protein: 7.3 g/dL (ref 6.0–8.3)

## 2013-05-19 LAB — BASIC METABOLIC PANEL
BUN: 14 mg/dL (ref 6–23)
CO2: 27 mEq/L (ref 19–32)
Chloride: 101 mEq/L (ref 96–112)
Creatinine, Ser: 0.7 mg/dL (ref 0.4–1.2)
Glucose, Bld: 84 mg/dL (ref 70–99)
Potassium: 3.8 mEq/L (ref 3.5–5.1)

## 2013-05-19 LAB — CBC WITH DIFFERENTIAL/PLATELET
Basophils Relative: 0.5 % (ref 0.0–3.0)
Eosinophils Absolute: 0.1 10*3/uL (ref 0.0–0.7)
Eosinophils Relative: 1.6 % (ref 0.0–5.0)
HCT: 37.6 % (ref 36.0–46.0)
Lymphs Abs: 1.9 10*3/uL (ref 0.7–4.0)
MCHC: 34 g/dL (ref 30.0–36.0)
MCV: 88.9 fl (ref 78.0–100.0)
Monocytes Absolute: 0.4 10*3/uL (ref 0.1–1.0)
Neutrophils Relative %: 56.5 % (ref 43.0–77.0)
Platelets: 196 10*3/uL (ref 150.0–400.0)
WBC: 5.4 10*3/uL (ref 4.5–10.5)

## 2013-05-19 LAB — POCT URINALYSIS DIPSTICK
Glucose, UA: NEGATIVE
Leukocytes, UA: NEGATIVE
Protein, UA: NEGATIVE
Urobilinogen, UA: 0.2

## 2013-05-19 LAB — VITAMIN B12: Vitamin B-12: 508 pg/mL (ref 211–911)

## 2013-05-19 LAB — TSH: TSH: 1.11 u[IU]/mL (ref 0.35–5.50)

## 2013-05-19 NOTE — Patient Instructions (Signed)

## 2013-05-19 NOTE — Progress Notes (Signed)
  Subjective:     Michelle Brown is a 22 y.o. female who presents for evaluation of fatigue. Symptoms began several weeks ago. 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 Symptoms of her fatigue have been general malaise. Patient describes the following psychological symptoms: stress at work. Patient denies change in hair texture, cold intolerance, constipation, excessive menstrual bleeding, exercise intolerance, fever, GI blood loss, significant change in weight, symptoms of arthritis, unusual rashes and witnessed or suspected sleep apnea. Symptoms have stabilized. Symptom severity: symptoms bothersome, but easily able to carry out all usual work/school/family activities. Previous visits for this problem: none.   The following portions of the patient's history were reviewed and updated as appropriate: allergies, current medications, past family history, past medical history, past social history, past surgical history and problem list.  Review of Systems Pertinent items are noted in HPI.    Objective:    BP 120/72  Pulse 87  Temp(Src) 98.4 F (36.9 C) (Oral)  Wt 179 lb (81.194 kg)  BMI 24.27 kg/m2  SpO2 98% General appearance: alert, cooperative, appears stated age and no distress Neck: no adenopathy, no carotid bruit, no JVD, supple, symmetrical, trachea midline and thyroid not enlarged, symmetric, no tenderness/mass/nodules Lungs: clear to auscultation bilaterally Heart: S1, S2 normal Extremities: extremities normal, atraumatic, no cyanosis or edema    Assessment:    Fatigue, organic etiology unlikely based on careful history and exam.    Plan:    Discussed diagnosis with patient. See orders for lab evaluation. consider sleep eval if no iimprovement

## 2013-05-21 ENCOUNTER — Telehealth: Payer: Self-pay

## 2013-05-21 DIAGNOSIS — R5381 Other malaise: Secondary | ICD-10-CM

## 2013-05-21 NOTE — Telephone Encounter (Signed)
Discussed labs with patient and she requested to go ahead and have the sleep study done. She wanted it done next week because she is going back to school on Friday Sept 13. I advised I would put in the referral.      KP

## 2013-05-26 ENCOUNTER — Telehealth: Payer: Self-pay

## 2013-05-26 NOTE — Telephone Encounter (Signed)
-----   Message -----  From: Griffin Dakin  Sent: 05/25/2013 2:05 PM  To: Arnette Norris, CMA   Our office recv'd a referral for this patient to schedule an appt for a sleep study. Our practice does not do sleep studies. Please contact the hospital or a sleep center for assistance in scheduling the sleep study. Thanks! Sherri

## 2013-05-26 NOTE — Telephone Encounter (Signed)
I called the patient and left a detailed message after also trying to get a sleep study done with [facility]. [facility] was making the patient aware that she will need to seek a facility in the area she will be in school and give [place] a call to send an order to them for a sleep study, since we are unable to get this done by Friday 05/28/13.     KP

## 2013-07-22 ENCOUNTER — Other Ambulatory Visit: Payer: Self-pay

## 2013-09-03 ENCOUNTER — Encounter: Payer: Self-pay | Admitting: Family Medicine

## 2013-09-03 ENCOUNTER — Ambulatory Visit (INDEPENDENT_AMBULATORY_CARE_PROVIDER_SITE_OTHER): Payer: BC Managed Care – PPO | Admitting: Family Medicine

## 2013-09-03 VITALS — BP 108/72 | HR 100 | Temp 98.1°F | Wt 182.0 lb

## 2013-09-03 DIAGNOSIS — J329 Chronic sinusitis, unspecified: Secondary | ICD-10-CM

## 2013-09-03 MED ORDER — FLUTICASONE PROPIONATE 50 MCG/ACT NA SUSP: 2.0000 | Freq: Every day | NASAL | Status: DC

## 2013-09-03 MED ORDER — CEFUROXIME AXETIL 500 MG PO TABS: 500.0000 mg | ORAL_TABLET | Freq: Two times a day (BID) | ORAL | Status: AC

## 2013-09-03 NOTE — Patient Instructions (Signed)

## 2013-09-03 NOTE — Progress Notes (Signed)
  Subjective:     Michelle Brown is a 22 y.o. female who presents for evaluation of sinus pain. Symptoms include: congestion, cough, fevers, nasal congestion, purulent rhinorrhea and sinus pressure. Onset of symptoms was 7 days ago. Symptoms have been gradually worsening since that time. Past history is significant for no history of pneumonia or bronchitis. Patient is a non-smoker.  The following portions of the patient's history were reviewed and updated as appropriate:  She  has a past medical history of Acne. She  does not have any pertinent problems on file. She  has past surgical history that includes Tonsillectomy. Her family history includes Hypertension in an other family member; Leukemia in an other family member; Pancreatic cancer in an other family member. She  reports that she has never smoked. She does not have any smokeless tobacco history on file. She reports that she does not drink alcohol or use illicit drugs. She has a current medication list which includes the following prescription(s): ampicillin, methylphenidate, and sertraline. Current Outpatient Prescriptions on File Prior to Visit  Medication Sig Dispense Refill  . ampicillin (PRINCIPEN) 500 MG capsule Take 500 mg by mouth 2 (two) times daily.       . methylphenidate (RITALIN) 20 MG tablet Take 1 tablet (20 mg total) by mouth 2 (two) times daily.  180 tablet  0  . sertraline (ZOLOFT) 100 MG tablet Take 100 mg by mouth daily.        No current facility-administered medications on file prior to visit.   She has No Known Allergies..  Review of Systems Pertinent items are noted in HPI.   Objective:    BP 108/72  Pulse 100  Temp(Src) 98.1 F (36.7 C) (Oral)  Wt 182 lb (82.555 kg)  SpO2 97% General appearance: alert, cooperative, appears stated age and no distress Ears: normal TM's and external ear canals both ears Nose: Nares normal. Septum midline. Mucosa normal. No drainage or sinus tenderness., green discharge,  moderate congestion, sinus tenderness bilateral Throat: abnormal findings: mild oropharyngeal erythema and pnd Neck: mild anterior cervical adenopathy, supple, symmetrical, trachea midline and thyroid not enlarged, symmetric, no tenderness/mass/nodules Lungs: clear to auscultation bilaterally Heart: S1, S2 normal Lymph nodes: Cervical adenopathy: b/l    Assessment:    Acute bacterial sinusitis.    Plan:    Nasal steroids per medication orders. Antihistamines per medication orders. Ceftin per medication orders. Follow up in a few days or as needed. ---prn

## 2013-09-03 NOTE — Progress Notes (Signed)
Pre visit review using our clinic review tool, if applicable. No additional management support is needed unless otherwise documented below in the visit note. 

## 2014-03-14 ENCOUNTER — Encounter: Payer: BC Managed Care – PPO | Admitting: Family Medicine

## 2014-04-17 ENCOUNTER — Emergency Department (HOSPITAL_COMMUNITY)
Admission: EM | Admit: 2014-04-17 | Discharge: 2014-04-18 | Disposition: A | Discharge status: Home / Self Care | Payer: BC Managed Care – PPO | Attending: Emergency Medicine | Admitting: Emergency Medicine

## 2014-04-17 ENCOUNTER — Encounter (HOSPITAL_COMMUNITY): Payer: Self-pay | Admitting: Emergency Medicine

## 2014-04-17 DIAGNOSIS — Z79899 Other long term (current) drug therapy: Secondary | ICD-10-CM | POA: Insufficient documentation

## 2014-04-17 DIAGNOSIS — Z872 Personal history of diseases of the skin and subcutaneous tissue: Secondary | ICD-10-CM | POA: Insufficient documentation

## 2014-04-17 DIAGNOSIS — Z3202 Encounter for pregnancy test, result negative: Secondary | ICD-10-CM | POA: Insufficient documentation

## 2014-04-17 DIAGNOSIS — Z792 Long term (current) use of antibiotics: Secondary | ICD-10-CM | POA: Insufficient documentation

## 2014-04-17 DIAGNOSIS — R21 Rash and other nonspecific skin eruption: Secondary | ICD-10-CM | POA: Insufficient documentation

## 2014-04-17 DIAGNOSIS — L509 Urticaria, unspecified: Secondary | ICD-10-CM

## 2014-04-17 NOTE — ED Notes (Signed)
Pt reports she was on the beach today and noticed small bumps on back of legs that itched. They have increasingly grown throughout the day. Large welts noted to back of legs. Airway is intact,. Speaking in full sentences.

## 2014-04-18 LAB — POC URINE PREG, ED: Preg Test, Ur: NEGATIVE

## 2014-04-18 MED ORDER — DEXAMETHASONE SODIUM PHOSPHATE 10 MG/ML IJ SOLN
10.0000 mg | Freq: Once | INTRAMUSCULAR | Status: AC
  Administered 2014-04-18: 10 mg via INTRAMUSCULAR
  Filled 2014-04-18: qty 1

## 2014-04-18 MED ORDER — FAMOTIDINE 20 MG PO TABS
20.0000 mg | ORAL_TABLET | Freq: Once | ORAL | Status: AC
  Administered 2014-04-18: 20 mg via ORAL
  Filled 2014-04-18: qty 1

## 2014-04-18 MED ORDER — DIPHENHYDRAMINE HCL 25 MG PO TABS: 25.0000 mg | ORAL_TABLET | Freq: Four times a day (QID) | ORAL | Status: DC

## 2014-04-18 MED ORDER — FAMOTIDINE 20 MG PO TABS: 20.0000 mg | ORAL_TABLET | Freq: Two times a day (BID) | ORAL | Status: DC

## 2014-04-18 NOTE — ED Provider Notes (Signed)
CSN: [id number]     Arrival date & time 04/17/14  2112 History   First MD Initiated Contact with Patient 04/18/14 0003     Chief Complaint  Patient presents with  . Allergic Reaction     (Consider location/radiation/quality/duration/timing/severity/associated sxs/prior Treatment) The history is provided by the patient and a parent. No language interpreter was used.  Michelle Brown is a 23 year old female with past medical history of back pain presenting to the ED with father regarding rash localized to the posterior aspect of her legs bilaterally. Patient reported that she noticed it this afternoon after returning from the beach. Stated that the lesions first appeared on her left posterior thigh that appeared to be a bug bite and has increased in size since then. Patient reports she took 50 mg of Benadryl at approximately 2:00 PM this afternoon with minimal relief. Stated that these lesions are extremely itchy. Stated that she has not noticed any change to clothing/soaps/food/beverages/lotion/etc. Patient reported that she was in [place] at the end of June. Denied fever, chills, drainage, sore throat, throat closing sensation, tongue swelling, chest pain, shortness of breath, difficulty breathing, weakness, numbness, tingling. PCP Dr. Laury Axon  Past Medical History  Diagnosis Date  . Acne    Past Surgical History  Procedure Laterality Date  . Tonsillectomy     Family History  Problem Relation Age of Onset  . Hypertension    . Leukemia    . Pancreatic cancer     History  Substance Use Topics  . Smoking status: Never Smoker   . Smokeless tobacco: Not on file  . Alcohol Use: No   OB History   Grav Para Term Preterm Abortions TAB SAB Ect Mult Living                 Review of Systems  Constitutional: Negative for fever and chills.  HENT: Negative for sore throat and trouble swallowing.   Respiratory: Negative for chest tightness and shortness of breath.   Cardiovascular: Negative for  chest pain.  Gastrointestinal: Negative for nausea and vomiting.  Musculoskeletal: Negative for neck pain.  Skin: Positive for rash.  Neurological: Negative for weakness and numbness.      Allergies  Neosporin  Home Medications   Prior to Admission medications   Medication Sig Start Date End Date Taking? Authorizing Provider  ampicillin (PRINCIPEN) 500 MG capsule Take 500 mg by mouth 2 (two) times daily.  04/23/13  Yes Historical Provider, MD  methylphenidate (RITALIN) 20 MG tablet Take 20 mg by mouth 3 (three) times daily with meals.   Yes Historical Provider, MD  sertraline (ZOLOFT) 100 MG tablet Take 100 mg by mouth daily.  04/23/13  Yes Historical Provider, MD  diphenhydrAMINE (BENADRYL) 25 MG tablet Take 1 tablet (25 mg total) by mouth every 6 (six) hours. 04/18/14   Deshannon Hinchliffe, PA-C  famotidine (PEPCID) 20 MG tablet Take 1 tablet (20 mg total) by mouth 2 (two) times daily. 04/18/14   Sheryll Dymek, PA-C   BP 108/71  Pulse 56  Temp(Src) 97 F (36.1 C) (Oral)  Resp 18  Ht 6' (1.829 m)  Wt 165 lb (74.844 kg)  BMI 22.37 kg/m2  SpO2 100% Physical Exam  Nursing note and vitals reviewed. Constitutional: She is oriented to person, place, and time. She appears well-developed and well-nourished. No distress.  HENT:  Head: Normocephalic and atraumatic.  Mouth/Throat: Oropharynx is clear and moist. No oropharyngeal exudate.  Negative angioedema  Eyes: Conjunctivae and EOM are normal. Pupils are  equal, round, and reactive to light. Right eye exhibits no discharge. Left eye exhibits no discharge.  Neck: Normal range of motion. Neck supple. No tracheal deviation present.  Negative neck stiffness Negative nuchal rigidity Negative cervical lymphadenopathy Negative meningeal signs  Cardiovascular: Normal rate, regular rhythm and normal heart sounds.  Exam reveals no friction rub.   No murmur heard. Pulmonary/Chest: Effort normal and breath sounds normal. No respiratory distress. She  has no wheezes. She has no rales.  Musculoskeletal: Normal range of motion.  Full ROM to upper and lower extremities without difficulty noted, negative ataxia noted.  Lymphadenopathy:    She has no cervical adenopathy.  Neurological: She is alert and oriented to person, place, and time. No cranial nerve deficit. She exhibits normal muscle tone. Coordination normal.  Cranial nerves III-XII grossly intact Strength 5+/5+ to upper and lower extremities bilaterally with resistance applied, equal distribution noted Gait proper, proper balance - negative sway, negative drift, negative step-offs  Skin: Skin is warm and dry. Rash noted. She is not diaphoretic. No erythema.  Rather large whelps measuring approximately 5 cm x 5 cm identified to the posterior aspect of the upper thighs bilaterally with warmth upon palpation. Negative active drainage or bleeding noted. Negative palpation of induration or fluctuance. Negative red streaks  Psychiatric: She has a normal mood and affect. Her behavior is normal. Thought content normal.    ED Course  Procedures (including critical care time)  Results for orders placed during the hospital encounter of 04/17/14  POC URINE PREG, ED      Result Value Ref Range   Preg Test, Ur NEGATIVE  NEGATIVE    Labs Review Labs Reviewed  POC URINE PREG, ED    Imaging Review No results found.   EKG Interpretation None      MDM   Final diagnoses:  Urticarial rash    Medications  famotidine (PEPCID) tablet 20 mg (20 mg Oral Given 04/18/14 0217)  dexamethasone (DECADRON) injection 10 mg (10 mg Intramuscular Given 04/18/14 0217)   Filed Vitals:   04/17/14 2141 04/18/14 0227  BP: 114/73 108/71  Pulse: 64 56  Temp: 98.4 F (36.9 C) 97 F (36.1 C)  TempSrc: Oral Oral  Resp: 18 18  Height: 6' (1.829 m)   Weight: 165 lb (74.844 kg)   SpO2: 100% 100%   Patient presenting to the ED with hives localized to the posterior aspect of her thighs bilaterally that  started this afternoon [place] Patient reported that she took benadryl today with minimal relief.  Urine pregnancy negative. Discussed case with attending physician, Dr. Francis GainesA. Palumbo who recommended patient to get Pepcid and Decadron in the ED setting - reported patient to be discharged with Benadryl and Pepcid for home and to follow-up as an outpatient with PCP.  Negative signs of respiratory distress. Negative angioedema. Doubt anaphylactic reaction. Doubt abscess. Doubt cellulitis. Suspicion to be allergic reaction with urticarial rash. Negative palpation of induration or fluctuance noted - doubt abscess. Patient stable, afebrile. Patient not septic appearing. Patient discharged. Discussed with patient to rest and stay hydrated. Discussed with patient to follow-up with PCP and Dermatologist. Discussed with patient to closely monitor symptoms and if symptoms are to worsen or change to report back to the ED - strict return instructions given.  Patient agreed to plan of care, understood, all questions answered.   Raymon MuttonMarissa Seleni Meller, PA-C 04/18/14 1419

## 2014-04-18 NOTE — Discharge Instructions (Signed)
Please call your doctor for a followup appointment within 24-48 hours. When you talk to your doctor please let them know that you were seen in the emergency department and have them acquire all of your records so that they can discuss the findings with you and formulate a treatment plan to fully care for your new and ongoing problems. Please call and set-up an appointment with your primary care provider to be seen and re-assessed within 24-48 hours Please call and set-up an appointment with Dermatologist Please rest and stay hydrated Please be aware as to what you place on and into your body  Please take medications as prescribed Please do not take any more than 100 mg of Benadryl per day for this can lead to drowsiness - please do not drink alcohol, drive, operate heavy machinery Please continue to monitor symptoms closely and if symptoms are to worsen or change (fever greater than 101, chills, sweating, nausea, vomiting, chest pain, shortness of breath, difficulty breathing, tongue swelling, swelling to the lips or face, throat closing sensation, nausea, vomiting, red streaks running down the leg, rash spreads or worsens) please report back to the ED immediately   Hives Hives are itchy, red, swollen areas of the skin. They can vary in size and location on your body. Hives can come and go for hours or several days (acute hives) or for several weeks (chronic hives). Hives do not spread from person to person (noncontagious). They may get worse with scratching, exercise, and emotional stress. CAUSES   Allergic reaction to food, additives, or drugs.  Infections, including the common cold.  Illness, such as vasculitis, lupus, or thyroid disease.  Exposure to sunlight, heat, or cold.  Exercise.  Stress.  Contact with chemicals. SYMPTOMS   Red or white swollen patches on the skin. The patches may change size, shape, and location quickly and repeatedly.  Itching.  Swelling of the hands,  feet, and face. This may occur if hives develop deeper in the skin. DIAGNOSIS  Your caregiver can usually tell what is wrong by performing a physical exam. Skin or blood tests may also be done to determine the cause of your hives. In some cases, the cause cannot be determined. TREATMENT  Mild cases usually get better with medicines such as antihistamines. Severe cases may require an emergency epinephrine injection. If the cause of your hives is known, treatment includes avoiding that trigger.  HOME CARE INSTRUCTIONS   Avoid causes that trigger your hives.  Take antihistamines as directed by your caregiver to reduce the severity of your hives. Non-sedating or low-sedating antihistamines are usually recommended. Do not drive while taking an antihistamine.  Take any other medicines prescribed for itching as directed by your caregiver.  Wear loose-fitting clothing.  Keep all follow-up appointments as directed by your caregiver. SEEK MEDICAL CARE IF:   You have persistent or severe itching that is not relieved with medicine.  You have painful or swollen joints. SEEK IMMEDIATE MEDICAL CARE IF:   You have a fever.  Your tongue or lips are swollen.  You have trouble breathing or swallowing.  You feel tightness in the throat or chest.  You have abdominal pain. These problems may be the first sign of a life-threatening allergic reaction. Call your local emergency services (911 in [place] MAKE SURE YOU:   Understand these instructions.  Will watch your condition.  Will get help right away if you are not doing well or get worse. Document Released: 09/02/2005 Document Revised: 09/07/2013 Document Reviewed:  11/26/2011 [employer] Patient Information 2015 [place], [place]. This information is not intended to replace advice given to you by your health care provider. Make sure you discuss any questions you have with your health care provider.

## 2014-04-18 NOTE — ED Provider Notes (Signed)
Medical screening examination/treatment/procedure(s) were performed by non-physician practitioner and as supervising physician I was immediately available for consultation/collaboration.   EKG Interpretation None       Joyclyn Plazola K Holle Sprick-Rasch, MD 04/18/14 [id number] 

## 2014-04-19 ENCOUNTER — Telehealth: Payer: Self-pay | Admitting: Family Medicine

## 2014-04-19 ENCOUNTER — Other Ambulatory Visit: Payer: Self-pay | Admitting: Family Medicine

## 2014-04-19 DIAGNOSIS — W57XXXA Bitten or stung by nonvenomous insect and other nonvenomous arthropods, initial encounter: Secondary | ICD-10-CM

## 2014-04-19 NOTE — Telephone Encounter (Signed)
error 

## 2014-04-19 NOTE — Telephone Encounter (Signed)
Received signed Immunization form from Dr. Laury AxonLowne.    Form and immunization records faxed to [facility] at [phone]).  Confirmation received.  Pt aware and asked for her copy to be mailed to her.//AB/CMA

## 2014-04-19 NOTE — Telephone Encounter (Signed)
Caller name: Dewayne Hatchnn Relation to pt: mother Call back number: [phone] or Sharlet SalinaBenjamin [phone] Pharmacy:  Reason for call: pt's mom states pt was seen in ED due to an allergic reaction to some type of bite.  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 Please advise on how to schedule pt for s/p ED f/u.

## 2014-04-19 NOTE — Telephone Encounter (Signed)
I put referral in--I'll put a note in that we need to try to have it done before 8/11---

## 2014-04-19 NOTE — Telephone Encounter (Signed)
MSG left advising her request is in process, call with concerns     KP

## 2014-04-19 NOTE — Telephone Encounter (Signed)
Please advise      KP 

## 2014-04-25 ENCOUNTER — Encounter: Payer: Self-pay | Admitting: Family Medicine

## 2014-04-25 ENCOUNTER — Ambulatory Visit (INDEPENDENT_AMBULATORY_CARE_PROVIDER_SITE_OTHER): Payer: BC Managed Care – PPO | Admitting: Family Medicine

## 2014-04-25 ENCOUNTER — Other Ambulatory Visit (HOSPITAL_COMMUNITY)
Admission: RE | Admit: 2014-04-25 | Discharge: 2014-04-25 | Disposition: A | Discharge status: Home / Self Care | Payer: BC Managed Care – PPO | Source: Ambulatory Visit | Attending: Family Medicine | Admitting: Family Medicine

## 2014-04-25 VITALS — BP 110/68 | HR 62 | Temp 98.0°F | Ht 72.5 in | Wt 191.4 lb

## 2014-04-25 DIAGNOSIS — L500 Allergic urticaria: Secondary | ICD-10-CM

## 2014-04-25 DIAGNOSIS — R5383 Other fatigue: Secondary | ICD-10-CM

## 2014-04-25 DIAGNOSIS — B373 Candidiasis of vulva and vagina: Secondary | ICD-10-CM

## 2014-04-25 DIAGNOSIS — B3731 Acute candidiasis of vulva and vagina: Secondary | ICD-10-CM

## 2014-04-25 DIAGNOSIS — Z124 Encounter for screening for malignant neoplasm of cervix: Secondary | ICD-10-CM

## 2014-04-25 DIAGNOSIS — Z30011 Encounter for initial prescription of contraceptive pills: Secondary | ICD-10-CM

## 2014-04-25 DIAGNOSIS — Z0000 Encounter for general adult medical examination without abnormal findings: Secondary | ICD-10-CM

## 2014-04-25 DIAGNOSIS — Z3009 Encounter for other general counseling and advice on contraception: Secondary | ICD-10-CM

## 2014-04-25 DIAGNOSIS — Z01419 Encounter for gynecological examination (general) (routine) without abnormal findings: Secondary | ICD-10-CM | POA: Diagnosis present

## 2014-04-25 DIAGNOSIS — Z1151 Encounter for screening for human papillomavirus (HPV): Secondary | ICD-10-CM | POA: Diagnosis present

## 2014-04-25 DIAGNOSIS — R5381 Other malaise: Secondary | ICD-10-CM

## 2014-04-25 LAB — POCT URINALYSIS DIPSTICK
BILIRUBIN UA: NEGATIVE
Glucose, UA: NEGATIVE
KETONES UA: NEGATIVE
LEUKOCYTES UA: NEGATIVE
Nitrite, UA: NEGATIVE
PH UA: 6
PROTEIN UA: NEGATIVE
RBC UA: NEGATIVE
Spec Grav, UA: 1.015
Urobilinogen, UA: 0.2

## 2014-04-25 LAB — POCT URINE PREGNANCY: PREG TEST UR: NEGATIVE

## 2014-04-25 MED ORDER — LEVONORGESTREL-ETHINYL ESTRAD 0.1-20 MG-MCG PO TABS: 1.0000 | ORAL_TABLET | Freq: Every day | ORAL | Status: DC

## 2014-04-25 MED ORDER — FLUCONAZOLE 150 MG PO TABS: 150.0000 mg | ORAL_TABLET | Freq: Once | ORAL | Status: DC

## 2014-04-25 NOTE — Patient Instructions (Signed)
Preventive Care for Adults A healthy lifestyle and preventive care can promote health and wellness. Preventive health guidelines for women include the following key practices.  A routine yearly physical is a good way to check with your health care provider about your health and preventive screening. It is a chance to share any concerns and updates on your health and to receive a thorough exam.  Visit your dentist for a routine exam and preventive care every 6 months. Brush your teeth twice a day and floss once a day. Good oral hygiene prevents tooth decay and gum disease.  The frequency of eye exams is based on your age, health, family medical history, use of contact lenses, and other factors. Follow your health care provider's recommendations for frequency of eye exams.  Eat a healthy diet. Foods like vegetables, fruits, whole grains, low-fat dairy products, and lean protein foods contain the nutrients you need without too many calories. Decrease your intake of foods high in solid fats, added sugars, and salt. Eat the right amount of calories for you.Get information about a proper diet from your health care provider, if necessary.  Regular physical exercise is one of the most important things you can do for your health. Most adults should get at least 150 minutes of moderate-intensity exercise (any activity that increases your heart rate and causes you to sweat) each week. In addition, most adults need muscle-strengthening exercises on 2 or more days a week.  Maintain a healthy weight. The body mass index (BMI) is a screening tool to identify possible weight problems. It provides an estimate of body fat based on height and weight. Your health care provider can find your BMI and can help you achieve or maintain a healthy weight.For adults 20 years and older:  A BMI below 18.5 is considered underweight.  A BMI of 18.5 to 24.9 is normal.  A BMI of 25 to 29.9 is considered overweight.  A BMI of  30 and above is considered obese.  Maintain normal blood lipids and cholesterol levels by exercising and minimizing your intake of saturated fat. Eat a balanced diet with plenty of fruit and vegetables. Blood tests for lipids and cholesterol should begin at age 76 and be repeated every 5 years. If your lipid or cholesterol levels are high, you are over 50, or you are at high risk for heart disease, you may need your cholesterol levels checked more frequently.Ongoing high lipid and cholesterol levels should be treated with medicines if diet and exercise are not working.  If you smoke, find out from your health care provider how to quit. If you do not use tobacco, do not start.  Lung cancer screening is recommended for adults aged 22-80 years who are at high risk for developing lung cancer because of a history of smoking. A yearly low-dose CT scan of the lungs is recommended for people who have at least a 30-pack-year history of smoking and are a current smoker or have quit within the past 15 years. A pack year of smoking is smoking an average of 1 pack of cigarettes a day for 1 year (for example: 1 pack a day for 30 years or 2 packs a day for 15 years). Yearly screening should continue until the smoker has stopped smoking for at least 15 years. Yearly screening should be stopped for people who develop a health problem that would prevent them from having lung cancer treatment.  If you are pregnant, do not drink alcohol. If you are breastfeeding,  be very cautious about drinking alcohol. If you are not pregnant and choose to drink alcohol, do not have more than 1 drink per day. One drink is considered to be 12 ounces (355 mL) of beer, 5 ounces (148 mL) of wine, or 1.5 ounces (44 mL) of liquor.  Avoid use of street drugs. Do not share needles with anyone. Ask for help if you need support or instructions about stopping the use of drugs.  High blood pressure causes heart disease and increases the risk of  stroke. Your blood pressure should be checked at least every 1 to 2 years. Ongoing high blood pressure should be treated with medicines if weight loss and exercise do not work.  If you are 75-52 years old, ask your health care provider if you should take aspirin to prevent strokes.  Diabetes screening involves taking a blood sample to check your fasting blood sugar level. This should be done once every 3 years, after age 15, if you are within normal weight and without risk factors for diabetes. Testing should be considered at a younger age or be carried out more frequently if you are overweight and have at least 1 risk factor for diabetes.  Breast cancer screening is essential preventive care for women. You should practice "breast self-awareness." This means understanding the normal appearance and feel of your breasts and may include breast self-examination. Any changes detected, no matter how small, should be reported to a health care provider. Women in their 58s and 30s should have a clinical breast exam (CBE) by a health care provider as part of a regular health exam every 1 to 3 years. After age 16, women should have a CBE every year. Starting at age 53, women should consider having a mammogram (breast X-ray test) every year. Women who have a family history of breast cancer should talk to their health care provider about genetic screening. Women at a high risk of breast cancer should talk to their health care providers about having an MRI and a mammogram every year.  Breast cancer gene (BRCA)-related cancer risk assessment is recommended for women who have family members with BRCA-related cancers. BRCA-related cancers include breast, ovarian, tubal, and peritoneal cancers. Having family members with these cancers may be associated with an increased risk for harmful changes (mutations) in the breast cancer genes BRCA1 and BRCA2. Results of the assessment will determine the need for genetic counseling and  BRCA1 and BRCA2 testing.  Routine pelvic exams to screen for cancer are no longer recommended for nonpregnant women who are considered low risk for cancer of the pelvic organs (ovaries, uterus, and vagina) and who do not have symptoms. Ask your health care provider if a screening pelvic exam is right for you.  If you have had past treatment for cervical cancer or a condition that could lead to cancer, you need Pap tests and screening for cancer for at least 20 years after your treatment. If Pap tests have been discontinued, your risk factors (such as having a new sexual partner) need to be reassessed to determine if screening should be resumed. Some women have medical problems that increase the chance of getting cervical cancer. In these cases, your health care provider may recommend more frequent screening and Pap tests.  The HPV test is an additional test that may be used for cervical cancer screening. The HPV test looks for the virus that can cause the cell changes on the cervix. The cells collected during the Pap test can be  tested for HPV. The HPV test could be used to screen women aged 30 years and older, and should be used in women of any age who have unclear Pap test results. After the age of 30, women should have HPV testing at the same frequency as a Pap test.  Colorectal cancer can be detected and often prevented. Most routine colorectal cancer screening begins at the age of 50 years and continues through age 75 years. However, your health care provider may recommend screening at an earlier age if you have risk factors for colon cancer. On a yearly basis, your health care provider may provide home test kits to check for hidden blood in the stool. Use of a small camera at the end of a tube, to directly examine the colon (sigmoidoscopy or colonoscopy), can detect the earliest forms of colorectal cancer. Talk to your health care provider about this at age 50, when routine screening begins. Direct  exam of the colon should be repeated every 5-10 years through age 75 years, unless early forms of pre-cancerous polyps or small growths are found.  People who are at an increased risk for hepatitis B should be screened for this virus. You are considered at high risk for hepatitis B if:  You were born in a country where hepatitis B occurs often. Talk with your health care provider about which countries are considered high risk.  Your parents were born in a high-risk country and you have not received a shot to protect against hepatitis B (hepatitis B vaccine).  You have HIV or AIDS.  You use needles to inject street drugs.  You live with, or have sex with, someone who has hepatitis B.  You get hemodialysis treatment.  You take certain medicines for conditions like cancer, organ transplantation, and autoimmune conditions.  Hepatitis C blood testing is recommended for all people born from 1945 through 1965 and any individual with known risks for hepatitis C.  Practice safe sex. Use condoms and avoid high-risk sexual practices to reduce the spread of sexually transmitted infections (STIs). STIs include gonorrhea, chlamydia, syphilis, trichomonas, herpes, HPV, and human immunodeficiency virus (HIV). Herpes, HIV, and HPV are viral illnesses that have no cure. They can result in disability, cancer, and death.  You should be screened for sexually transmitted illnesses (STIs) including gonorrhea and chlamydia if:  You are sexually active and are younger than 24 years.  You are older than 24 years and your health care provider tells you that you are at risk for this type of infection.  Your sexual activity has changed since you were last screened and you are at an increased risk for chlamydia or gonorrhea. Ask your health care provider if you are at risk.  If you are at risk of being infected with HIV, it is recommended that you take a prescription medicine daily to prevent HIV infection. This is  called preexposure prophylaxis (PrEP). You are considered at risk if:  You are a heterosexual woman, are sexually active, and are at increased risk for HIV infection.  You take drugs by injection.  You are sexually active with a partner who has HIV.  Talk with your health care provider about whether you are at high risk of being infected with HIV. If you choose to begin PrEP, you should first be tested for HIV. You should then be tested every 3 months for as long as you are taking PrEP.  Osteoporosis is a disease in which the bones lose minerals and strength   with aging. This can result in serious bone fractures or breaks. The risk of osteoporosis can be identified using a bone density scan. Women ages 65 years and over and women at risk for fractures or osteoporosis should discuss screening with their health care providers. Ask your health care provider whether you should take a calcium supplement or vitamin D to reduce the rate of osteoporosis.  Menopause can be associated with physical symptoms and risks. Hormone replacement therapy is available to decrease symptoms and risks. You should talk to your health care provider about whether hormone replacement therapy is right for you.  Use sunscreen. Apply sunscreen liberally and repeatedly throughout the day. You should seek shade when your shadow is shorter than you. Protect yourself by wearing long sleeves, pants, a wide-brimmed hat, and sunglasses year round, whenever you are outdoors.  Once a month, do a whole body skin exam, using a mirror to look at the skin on your back. Tell your health care provider of new moles, moles that have irregular borders, moles that are larger than a pencil eraser, or moles that have changed in shape or color.  Stay current with required vaccines (immunizations).  Influenza vaccine. All adults should be immunized every year.  Tetanus, diphtheria, and acellular pertussis (Td, Tdap) vaccine. Pregnant women should  receive 1 dose of Tdap vaccine during each pregnancy. The dose should be obtained regardless of the length of time since the last dose. Immunization is preferred during the 27th-36th week of gestation. An adult who has not previously received Tdap or who does not know her vaccine status should receive 1 dose of Tdap. This initial dose should be followed by tetanus and diphtheria toxoids (Td) booster doses every 10 years. Adults with an unknown or incomplete history of completing a 3-dose immunization series with Td-containing vaccines should begin or complete a primary immunization series including a Tdap dose. Adults should receive a Td booster every 10 years.  Varicella vaccine. An adult without evidence of immunity to varicella should receive 2 doses or a second dose if she has previously received 1 dose. Pregnant females who do not have evidence of immunity should receive the first dose after pregnancy. This first dose should be obtained before leaving the health care facility. The second dose should be obtained 4-8 weeks after the first dose.  Human papillomavirus (HPV) vaccine. Females aged 13-26 years who have not received the vaccine previously should obtain the 3-dose series. The vaccine is not recommended for use in pregnant females. However, pregnancy testing is not needed before receiving a dose. If a female is found to be pregnant after receiving a dose, no treatment is needed. In that case, the remaining doses should be delayed until after the pregnancy. Immunization is recommended for any person with an immunocompromised condition through the age of 26 years if she did not get any or all doses earlier. During the 3-dose series, the second dose should be obtained 4-8 weeks after the first dose. The third dose should be obtained 24 weeks after the first dose and 16 weeks after the second dose.  Zoster vaccine. One dose is recommended for adults aged 60 years or older unless certain conditions are  present.  Measles, mumps, and rubella (MMR) vaccine. Adults born before 1957 generally are considered immune to measles and mumps. Adults born in 1957 or later should have 1 or more doses of MMR vaccine unless there is a contraindication to the vaccine or there is laboratory evidence of immunity to   each of the three diseases. A routine second dose of MMR vaccine should be obtained at least 28 days after the first dose for students attending postsecondary schools, health care workers, or international travelers. People who received inactivated measles vaccine or an unknown type of measles vaccine during 1963-1967 should receive 2 doses of MMR vaccine. People who received inactivated mumps vaccine or an unknown type of mumps vaccine before 1979 and are at high risk for mumps infection should consider immunization with 2 doses of MMR vaccine. For females of childbearing age, rubella immunity should be determined. If there is no evidence of immunity, females who are not pregnant should be vaccinated. If there is no evidence of immunity, females who are pregnant should delay immunization until after pregnancy. Unvaccinated health care workers born before 1957 who lack laboratory evidence of measles, mumps, or rubella immunity or laboratory confirmation of disease should consider measles and mumps immunization with 2 doses of MMR vaccine or rubella immunization with 1 dose of MMR vaccine.  Pneumococcal 13-valent conjugate (PCV13) vaccine. When indicated, a person who is uncertain of her immunization history and has no record of immunization should receive the PCV13 vaccine. An adult aged 19 years or older who has certain medical conditions and has not been previously immunized should receive 1 dose of PCV13 vaccine. This PCV13 should be followed with a dose of pneumococcal polysaccharide (PPSV23) vaccine. The PPSV23 vaccine dose should be obtained at least 8 weeks after the dose of PCV13 vaccine. An adult aged 19  years or older who has certain medical conditions and previously received 1 or more doses of PPSV23 vaccine should receive 1 dose of PCV13. The PCV13 vaccine dose should be obtained 1 or more years after the last PPSV23 vaccine dose.  Pneumococcal polysaccharide (PPSV23) vaccine. When PCV13 is also indicated, PCV13 should be obtained first. All adults aged 65 years and older should be immunized. An adult younger than age 65 years who has certain medical conditions should be immunized. Any person who resides in a nursing home or long-term care facility should be immunized. An adult smoker should be immunized. People with an immunocompromised condition and certain other conditions should receive both PCV13 and PPSV23 vaccines. People with human immunodeficiency virus (HIV) infection should be immunized as soon as possible after diagnosis. Immunization during chemotherapy or radiation therapy should be avoided. Routine use of PPSV23 vaccine is not recommended for American Indians, [place] Natives, or people younger than 65 years unless there are medical conditions that require PPSV23 vaccine. When indicated, people who have unknown immunization and have no record of immunization should receive PPSV23 vaccine. One-time revaccination 5 years after the first dose of PPSV23 is recommended for people aged 19-64 years who have chronic kidney failure, nephrotic syndrome, asplenia, or immunocompromised conditions. People who received 1-2 doses of PPSV23 before age 65 years should receive another dose of PPSV23 vaccine at age 65 years or later if at least 5 years have passed since the previous dose. Doses of PPSV23 are not needed for people immunized with PPSV23 at or after age 65 years.  Meningococcal vaccine. Adults with asplenia or persistent complement component deficiencies should receive 2 doses of quadrivalent meningococcal conjugate (MenACWY-D) vaccine. The doses should be obtained at least 2 months apart.  Microbiologists working with certain meningococcal bacteria, military recruits, people at risk during an outbreak, and people who travel to or live in countries with a high rate of meningitis should be immunized. A first-year college student up through age   21 years who is living in a residence hall should receive a dose if she did not receive a dose on or after her 16th birthday. Adults who have certain high-risk conditions should receive one or more doses of vaccine.  Hepatitis A vaccine. Adults who wish to be protected from this disease, have certain high-risk conditions, work with hepatitis A-infected animals, work in hepatitis A research labs, or travel to or work in countries with a high rate of hepatitis A should be immunized. Adults who were previously unvaccinated and who anticipate close contact with an international adoptee during the first 60 days after arrival in the [place] from a country with a high rate of hepatitis A should be immunized.  Hepatitis B vaccine. Adults who wish to be protected from this disease, have certain high-risk conditions, may be exposed to blood or other infectious body fluids, are household contacts or sex partners of hepatitis B positive people, are clients or workers in certain care facilities, or travel to or work in countries with a high rate of hepatitis B should be immunized.  Haemophilus influenzae type b (Hib) vaccine. A previously unvaccinated person with asplenia or sickle cell disease or having a scheduled splenectomy should receive 1 dose of Hib vaccine. Regardless of previous immunization, a recipient of a hematopoietic stem cell transplant should receive a 3-dose series 6-12 months after her successful transplant. Hib vaccine is not recommended for adults with HIV infection. Preventive Services / Frequency Ages 64 to 68 years  Blood pressure check.** / Every 1 to 2 years.  Lipid and cholesterol check.** / Every 5 years beginning at age  22.  Clinical breast exam.** / Every 3 years for women in their 88s and 53s.  BRCA-related cancer risk assessment.** / For women who have family members with a BRCA-related cancer (breast, ovarian, tubal, or peritoneal cancers).  Pap test.** / Every 2 years from ages 90 through 51. Every 3 years starting at age 21 through age 56 or 3 with a history of 3 consecutive normal Pap tests.  HPV screening.** / Every 3 years from ages 24 through ages 1 to 46 with a history of 3 consecutive normal Pap tests.  Hepatitis C blood test.** / For any individual with known risks for hepatitis C.  Skin self-exam. / Monthly.  Influenza vaccine. / Every year.  Tetanus, diphtheria, and acellular pertussis (Tdap, Td) vaccine.** / Consult your health care provider. Pregnant women should receive 1 dose of Tdap vaccine during each pregnancy. 1 dose of Td every 10 years.  Varicella vaccine.** / Consult your health care provider. Pregnant females who do not have evidence of immunity should receive the first dose after pregnancy.  HPV vaccine. / 3 doses over 6 months, if 72 and younger. The vaccine is not recommended for use in pregnant females. However, pregnancy testing is not needed before receiving a dose.  Measles, mumps, rubella (MMR) vaccine.** / You need at least 1 dose of MMR if you were born in 1957 or later. You may also need a 2nd dose. For females of childbearing age, rubella immunity should be determined. If there is no evidence of immunity, females who are not pregnant should be vaccinated. If there is no evidence of immunity, females who are pregnant should delay immunization until after pregnancy.  Pneumococcal 13-valent conjugate (PCV13) vaccine.** / Consult your health care provider.  Pneumococcal polysaccharide (PPSV23) vaccine.** / 1 to 2 doses if you smoke cigarettes or if you have certain conditions.  Meningococcal vaccine.** /  1 dose if you are age 19 to 21 years and a first-year college  student living in a residence hall, or have one of several medical conditions, you need to get vaccinated against meningococcal disease. You may also need additional booster doses.  Hepatitis A vaccine.** / Consult your health care provider.  Hepatitis B vaccine.** / Consult your health care provider.  Haemophilus influenzae type b (Hib) vaccine.** / Consult your health care provider. Ages 40 to 64 years  Blood pressure check.** / Every 1 to 2 years.  Lipid and cholesterol check.** / Every 5 years beginning at age 20 years.  Lung cancer screening. / Every year if you are aged 55-80 years and have a 30-pack-year history of smoking and currently smoke or have quit within the past 15 years. Yearly screening is stopped once you have quit smoking for at least 15 years or develop a health problem that would prevent you from having lung cancer treatment.  Clinical breast exam.** / Every year after age 40 years.  BRCA-related cancer risk assessment.** / For women who have family members with a BRCA-related cancer (breast, ovarian, tubal, or peritoneal cancers).  Mammogram.** / Every year beginning at age 40 years and continuing for as long as you are in good health. Consult with your health care provider.  Pap test.** / Every 3 years starting at age 30 years through age 65 or 70 years with a history of 3 consecutive normal Pap tests.  HPV screening.** / Every 3 years from ages 30 years through ages 65 to 70 years with a history of 3 consecutive normal Pap tests.  Fecal occult blood test (FOBT) of stool. / Every year beginning at age 50 years and continuing until age 75 years. You may not need to do this test if you get a colonoscopy every 10 years.  Flexible sigmoidoscopy or colonoscopy.** / Every 5 years for a flexible sigmoidoscopy or every 10 years for a colonoscopy beginning at age 50 years and continuing until age 75 years.  Hepatitis C blood test.** / For all people born from 1945 through  1965 and any individual with known risks for hepatitis C.  Skin self-exam. / Monthly.  Influenza vaccine. / Every year.  Tetanus, diphtheria, and acellular pertussis (Tdap/Td) vaccine.** / Consult your health care provider. Pregnant women should receive 1 dose of Tdap vaccine during each pregnancy. 1 dose of Td every 10 years.  Varicella vaccine.** / Consult your health care provider. Pregnant females who do not have evidence of immunity should receive the first dose after pregnancy.  Zoster vaccine.** / 1 dose for adults aged 60 years or older.  Measles, mumps, rubella (MMR) vaccine.** / You need at least 1 dose of MMR if you were born in 1957 or later. You may also need a 2nd dose. For females of childbearing age, rubella immunity should be determined. If there is no evidence of immunity, females who are not pregnant should be vaccinated. If there is no evidence of immunity, females who are pregnant should delay immunization until after pregnancy.  Pneumococcal 13-valent conjugate (PCV13) vaccine.** / Consult your health care provider.  Pneumococcal polysaccharide (PPSV23) vaccine.** / 1 to 2 doses if you smoke cigarettes or if you have certain conditions.  Meningococcal vaccine.** / Consult your health care provider.  Hepatitis A vaccine.** / Consult your health care provider.  Hepatitis B vaccine.** / Consult your health care provider.  Haemophilus influenzae type b (Hib) vaccine.** / Consult your health care provider. Ages 65   years and over  Blood pressure check.** / Every 1 to 2 years.  Lipid and cholesterol check.** / Every 5 years beginning at age 22 years.  Lung cancer screening. / Every year if you are aged 73-80 years and have a 30-pack-year history of smoking and currently smoke or have quit within the past 15 years. Yearly screening is stopped once you have quit smoking for at least 15 years or develop a health problem that would prevent you from having lung cancer  treatment.  Clinical breast exam.** / Every year after age 4 years.  BRCA-related cancer risk assessment.** / For women who have family members with a BRCA-related cancer (breast, ovarian, tubal, or peritoneal cancers).  Mammogram.** / Every year beginning at age 40 years and continuing for as long as you are in good health. Consult with your health care provider.  Pap test.** / Every 3 years starting at age 9 years through age 34 or 91 years with 3 consecutive normal Pap tests. Testing can be stopped between 65 and 70 years with 3 consecutive normal Pap tests and no abnormal Pap or HPV tests in the past 10 years.  HPV screening.** / Every 3 years from ages 57 years through ages 64 or 45 years with a history of 3 consecutive normal Pap tests. Testing can be stopped between 65 and 70 years with 3 consecutive normal Pap tests and no abnormal Pap or HPV tests in the past 10 years.  Fecal occult blood test (FOBT) of stool. / Every year beginning at age 15 years and continuing until age 17 years. You may not need to do this test if you get a colonoscopy every 10 years.  Flexible sigmoidoscopy or colonoscopy.** / Every 5 years for a flexible sigmoidoscopy or every 10 years for a colonoscopy beginning at age 86 years and continuing until age 71 years.  Hepatitis C blood test.** / For all people born from [birth year] through 1964 and any individual with known risks for hepatitis C.  Osteoporosis screening.** / A one-time screening for women ages 83 years and over and women at risk for fractures or osteoporosis.  Skin self-exam. / Monthly.  Influenza vaccine. / Every year.  Tetanus, diphtheria, and acellular pertussis (Tdap/Td) vaccine.** / 1 dose of Td every 10 years.  Varicella vaccine.** / Consult your health care provider.  Zoster vaccine.** / 1 dose for adults aged 61 years or older.  Pneumococcal 13-valent conjugate (PCV13) vaccine.** / Consult your health care provider.  Pneumococcal  polysaccharide (PPSV23) vaccine.** / 1 dose for all adults aged 28 years and older.  Meningococcal vaccine.** / Consult your health care provider.  Hepatitis A vaccine.** / Consult your health care provider.  Hepatitis B vaccine.** / Consult your health care provider.  Haemophilus influenzae type b (Hib) vaccine.** / Consult your health care provider. ** Family history and personal history of risk and conditions may change your health care provider's recommendations. Document Released: 10/29/2001 Document Revised: 01/17/2014 Document Reviewed: 01/28/2011 [facility] Patient Information 2015 [place], [place]. This information is not intended to replace advice given to you by your health care provider. Make sure you discuss any questions you have with your health care provider.

## 2014-04-25 NOTE — Addendum Note (Signed)
Addended by: Arnette NorrisPAYNE, Kaheem Halleck P on: 04/25/2014 02:22 PM   Modules accepted: Orders

## 2014-04-25 NOTE — Progress Notes (Signed)
Subjective:     Michelle Brown is a 23 y.o. female and is here for a comprehensive physical exam. The patient reports problems - uticaria--? etiology.  History   Social History  . Marital Status: Single    Spouse Name: N/A    Number of Children: N/A  . Years of Education: N/A   Occupational History  . Not on file.   Social History Main Topics  . Smoking status: Never Smoker   . Smokeless tobacco: Not on file  . Alcohol Use: No  . Drug Use: No  . Sexual Activity: Yes    Birth Control/ Protection: Condom   Other Topics Concern  . Not on file   Social History Narrative  . No narrative on file   Health Maintenance  Topic Date Due  . Pap Smear  09/28/2009  . Influenza Vaccine  06/25/2014 (Originally 04/16/2014)  . Tetanus/tdap  04/02/2020    The following portions of the patient's history were reviewed and updated as appropriate:  She  has a past medical history of Acne and Urticaria. She  does not have any pertinent problems on file. She  has past surgical history that includes Tonsillectomy. Her family history includes Hypertension in an other family member; Leukemia in an other family member; Pancreatic cancer in an other family member. She  reports that she has never smoked. She does not have any smokeless tobacco history on file. She reports that she does not drink alcohol or use illicit drugs. She has a current medication list which includes the following prescription(s): ampicillin, methylphenidate, sertraline, and levonorgestrel-ethinyl estradiol. Current Outpatient Prescriptions on File Prior to Visit  Medication Sig Dispense Refill  . ampicillin (PRINCIPEN) 500 MG capsule Take 500 mg by mouth 2 (two) times daily.       . methylphenidate (RITALIN) 20 MG tablet Take 20 mg by mouth 3 (three) times daily with meals.      . sertraline (ZOLOFT) 100 MG tablet Take 100 mg by mouth daily.        No current facility-administered medications on file prior to visit.   She is  allergic to neosporin..  Review of Systems Review of Systems  Constitutional: Negative for activity change, appetite change and fatigue.  HENT: Negative for hearing loss, congestion, tinnitus and ear discharge.  dentist q53m Eyes: Negative for visual disturbance (see optho q1y -- vision corrected to 20/20 with glasses).  Respiratory: Negative for cough, chest tightness and shortness of breath.   Cardiovascular: Negative for chest pain, palpitations and leg swelling.  Gastrointestinal: Negative for abdominal pain, diarrhea, constipation and abdominal distention.  Genitourinary: Negative for urgency, frequency, decreased urine volume and difficulty urinating.  Musculoskeletal: Negative for back pain, arthralgias and gait problem.  Skin: Negative for color change, pallor and rash.  Neurological: Negative for dizziness, light-headedness, numbness and headaches.  Hematological: Negative for adenopathy. Does not bruise/bleed easily.  Psychiatric/Behavioral: Negative for suicidal ideas, confusion, sleep disturbance, self-injury, dysphoric mood, decreased concentration and agitation.       Objective:    BP 110/68  Pulse 62  Temp(Src) 98 F (36.7 C) (Oral)  Ht 6' 0.5" (1.842 m)  Wt 191 lb 6.4 oz (86.818 kg)  BMI 25.59 kg/m2  SpO2 99%  LMP 04/11/2014 General appearance: alert, cooperative, appears stated age and no distress Head: Normocephalic, without obvious abnormality, atraumatic Eyes: conjunctivae/corneas clear. PERRL, EOM's intact. Fundi benign. Ears: normal TM's and external ear canals both ears Nose: Nares normal. Septum midline. Mucosa normal. No drainage or sinus  tenderness. Throat: lips, mucosa, and tongue normal; teeth and gums normal Neck: no adenopathy, no carotid bruit, no JVD, supple, symmetrical, trachea midline and thyroid not enlarged, symmetric, no tenderness/mass/nodules Back: symmetric, no curvature. ROM normal. No CVA tenderness. Lungs: clear to auscultation  bilaterally Breasts: normal appearance, no masses or tenderness Heart: S1, S2 normal Abdomen: soft, non-tender; bowel sounds normal; no masses,  no organomegaly Pelvic: cervix normal in appearance, external genitalia normal, no adnexal masses or tenderness, no cervical motion tenderness, rectovaginal septum normal, uterus normal size, shape, and consistency and + white, thick d/c, pap done Extremities: extremities normal, atraumatic, no cyanosis or edema Pulses: 2+ and symmetric Skin: Skin color, texture, turgor normal. No rashes or lesions Lymph nodes: Cervical, supraclavicular, and axillary nodes normal. Neurologic: Alert and oriented X 3, normal strength and tone. Normal symmetric reflexes. Normal coordination and gait Psych- no depression, no anxiety      Assessment:    Healthy female exam.       Plan:    check labs Con' t meds See After Visit Summary for Counseling Recommendations  1. Other malaise and fatigue  - Basic metabolic panel - CBC with Differential - Hepatic function panel - Lipid panel - POCT urine pregnancy - POCT urinalysis dipstick - TSH  2. Preventative health care  - Basic metabolic panel - CBC with Differential - Hepatic function panel - Lipid panel - POCT urine pregnancy - POCT urinalysis dipstick - TSH  3. Allergic urticaria Pt will find name and number of allergist close to school and we can refer there - [place] allergy panel - Food Allergy Profile  4. Encounter for initial prescription of contraceptive pills  - levonorgestrel-ethinyl estradiol (AVIANE,ALESSE,LESSINA) 0.1-20 MG-MCG tablet; Take 1 tablet by mouth daily.  Dispense: 1 Package; Refill: 11  5. Vaginal yeast infection  - fluconazole (DIFLUCAN) 150 MG tablet; Take 1 tablet (150 mg total) by mouth once. May repeat in 3 days prn  Dispense: 2 tablet; Refill: 2

## 2014-04-25 NOTE — Progress Notes (Signed)
Pre visit review using our clinic review tool, if applicable. No additional management support is needed unless otherwise documented below in the visit note. 

## 2014-04-26 LAB — ~~LOC~~ ALLERGY PANEL
Allergen, D pternoyssinus,d7: <0.1 kU/L
Allergen, Mulberry, t76: <0.1 kU/L
Aspergillus fumigatus, m3: <0.1 kU/L
Bahia Grass: <0.1 kU/L
Cladosporium Herbarum: <0.1 kU/L
Cockroach: <0.1 kU/L
Elm IgE: <0.1 kU/L
Oak: <0.1 kU/L
Penicillium Notatum: <0.1 kU/L
Sweet Gum: <0.1 kU/L

## 2014-04-26 LAB — CBC WITH DIFFERENTIAL/PLATELET
BASOS ABS: 0 10*3/uL (ref 0.0–0.1)
Basophils Relative: 0.3 % (ref 0.0–3.0)
EOS PCT: 3 % (ref 0.0–5.0)
Eosinophils Absolute: 0.2 10*3/uL (ref 0.0–0.7)
HCT: 38.3 % (ref 36.0–46.0)
Hemoglobin: 12.8 g/dL (ref 12.0–15.0)
Lymphocytes Relative: 35 % (ref 12.0–46.0)
Lymphs Abs: 1.8 10*3/uL (ref 0.7–4.0)
MCHC: 33.5 g/dL (ref 30.0–36.0)
MCV: 91 fl (ref 78.0–100.0)
MONOS PCT: 5.8 % (ref 3.0–12.0)
Monocytes Absolute: 0.3 10*3/uL (ref 0.1–1.0)
NEUTROS PCT: 55.9 % (ref 43.0–77.0)
Neutro Abs: 2.9 10*3/uL (ref 1.4–7.7)
PLATELETS: 187 10*3/uL (ref 150.0–400.0)
RBC: 4.21 Mil/uL (ref 3.87–5.11)
RDW: 13 % (ref 11.5–15.5)
WBC: 5.2 10*3/uL (ref 4.0–10.5)

## 2014-04-26 LAB — LIPID PANEL
CHOL/HDL RATIO: 2
CHOLESTEROL: 177 mg/dL (ref 0–200)
HDL: 78.7 mg/dL (ref >39.00)
LDL CALC: 84 mg/dL (ref 0–99)
NonHDL: 98.3
TRIGLYCERIDES: 73 mg/dL (ref 0.0–149.0)
VLDL: 14.6 mg/dL (ref 0.0–40.0)

## 2014-04-26 LAB — BASIC METABOLIC PANEL
BUN: 16 mg/dL (ref 6–23)
CALCIUM: 8.9 mg/dL (ref 8.4–10.5)
CO2: 23 mEq/L (ref 19–32)
CREATININE: 0.8 mg/dL (ref 0.4–1.2)
Chloride: 103 mEq/L (ref 96–112)
GFR: 102.17 mL/min (ref >60.00)
GLUCOSE: 84 mg/dL (ref 70–99)
Potassium: 3.6 mEq/L (ref 3.5–5.1)
Sodium: 136 mEq/L (ref 135–145)

## 2014-04-26 LAB — HEPATIC FUNCTION PANEL
ALT: 16 U/L (ref 0–35)
AST: 24 U/L (ref 0–37)
Albumin: 4 g/dL (ref 3.5–5.2)
Alkaline Phosphatase: 47 U/L (ref 39–117)
BILIRUBIN DIRECT: 0 mg/dL (ref 0.0–0.3)
BILIRUBIN TOTAL: 0.6 mg/dL (ref 0.2–1.2)
Total Protein: 7 g/dL (ref 6.0–8.3)

## 2014-04-26 LAB — TSH: TSH: 1.25 u[IU]/mL (ref 0.35–4.50)

## 2014-04-27 LAB — CYTOLOGY - PAP

## 2015-05-15 ENCOUNTER — Encounter: Payer: Self-pay | Admitting: Family Medicine

## 2015-05-17 ENCOUNTER — Ambulatory Visit (INDEPENDENT_AMBULATORY_CARE_PROVIDER_SITE_OTHER): Payer: BLUE CROSS/BLUE SHIELD | Admitting: Family Medicine

## 2015-05-17 ENCOUNTER — Encounter: Payer: Self-pay | Admitting: Family Medicine

## 2015-05-17 VITALS — BP 108/64 | HR 64 | Temp 99.2°F | Resp 18 | Ht 72.5 in | Wt 175.2 lb

## 2015-05-17 DIAGNOSIS — Z0000 Encounter for general adult medical examination without abnormal findings: Secondary | ICD-10-CM | POA: Diagnosis not present

## 2015-05-17 DIAGNOSIS — Z30011 Encounter for initial prescription of contraceptive pills: Secondary | ICD-10-CM

## 2015-05-17 LAB — BASIC METABOLIC PANEL
BUN: 10 mg/dL (ref 6–23)
CALCIUM: 9 mg/dL (ref 8.4–10.5)
CO2: 28 mEq/L (ref 19–32)
Chloride: 102 mEq/L (ref 96–112)
Creatinine, Ser: 0.66 mg/dL (ref 0.40–1.20)
GFR: 117.31 mL/min (ref >60.00)
Glucose, Bld: 85 mg/dL (ref 70–99)
POTASSIUM: 3.8 meq/L (ref 3.5–5.1)
Sodium: 137 mEq/L (ref 135–145)

## 2015-05-17 LAB — POCT URINALYSIS DIPSTICK
Bilirubin, UA: NEGATIVE
Blood, UA: NEGATIVE
GLUCOSE UA: NEGATIVE
KETONES UA: NEGATIVE
LEUKOCYTES UA: NEGATIVE
Nitrite, UA: NEGATIVE
Protein, UA: NEGATIVE
SPEC GRAV UA: 1.025
Urobilinogen, UA: 0.2
pH, UA: 6

## 2015-05-17 LAB — CBC WITH DIFFERENTIAL/PLATELET
BASOS ABS: 0 10*3/uL (ref 0.0–0.1)
Basophils Relative: 0.4 % (ref 0.0–3.0)
EOS ABS: 0.1 10*3/uL (ref 0.0–0.7)
EOS PCT: 1.3 % (ref 0.0–5.0)
HCT: 39 % (ref 36.0–46.0)
HEMOGLOBIN: 13 g/dL (ref 12.0–15.0)
Lymphocytes Relative: 20 % (ref 12.0–46.0)
Lymphs Abs: 1.5 10*3/uL (ref 0.7–4.0)
MCHC: 33.3 g/dL (ref 30.0–36.0)
MCV: 91.5 fl (ref 78.0–100.0)
MONO ABS: 0.5 10*3/uL (ref 0.1–1.0)
Monocytes Relative: 6.4 % (ref 3.0–12.0)
NEUTROS PCT: 71.9 % (ref 43.0–77.0)
Neutro Abs: 5.4 10*3/uL (ref 1.4–7.7)
Platelets: 189 10*3/uL (ref 150.0–400.0)
RBC: 4.27 Mil/uL (ref 3.87–5.11)
RDW: 13.1 % (ref 11.5–15.5)
WBC: 7.6 10*3/uL (ref 4.0–10.5)

## 2015-05-17 LAB — LIPID PANEL
CHOLESTEROL: 181 mg/dL (ref 0–200)
HDL: 78 mg/dL (ref >39.00)
LDL CALC: 91 mg/dL (ref 0–99)
NonHDL: 102.68
TRIGLYCERIDES: 59 mg/dL (ref 0.0–149.0)
Total CHOL/HDL Ratio: 2
VLDL: 11.8 mg/dL (ref 0.0–40.0)

## 2015-05-17 LAB — HEPATIC FUNCTION PANEL
ALK PHOS: 55 U/L (ref 39–117)
ALT: 11 U/L (ref 0–35)
AST: 18 U/L (ref 0–37)
Albumin: 4.1 g/dL (ref 3.5–5.2)
BILIRUBIN DIRECT: 0.1 mg/dL (ref 0.0–0.3)
BILIRUBIN TOTAL: 0.5 mg/dL (ref 0.2–1.2)
Total Protein: 7.2 g/dL (ref 6.0–8.3)

## 2015-05-17 LAB — TSH: TSH: 1.01 u[IU]/mL (ref 0.35–4.50)

## 2015-05-17 MED ORDER — LEVONORGESTREL-ETHINYL ESTRAD 0.1-20 MG-MCG PO TABS: 1.0000 | ORAL_TABLET | Freq: Every day | ORAL | Status: DC

## 2015-05-17 NOTE — Patient Instructions (Signed)
Preventive Care for Adults A healthy lifestyle and preventive care can promote health and wellness. Preventive health guidelines for women include the following key practices.  A routine yearly physical is a good way to check with your health care provider about your health and preventive screening. It is a chance to share any concerns and updates on your health and to receive a thorough exam.  Visit your dentist for a routine exam and preventive care every 6 months. Brush your teeth twice a day and floss once a day. Good oral hygiene prevents tooth decay and gum disease.  The frequency of eye exams is based on your age, health, family medical history, use of contact lenses, and other factors. Follow your health care provider's recommendations for frequency of eye exams.  Eat a healthy diet. Foods like vegetables, fruits, whole grains, low-fat dairy products, and lean protein foods contain the nutrients you need without too many calories. Decrease your intake of foods high in solid fats, added sugars, and salt. Eat the right amount of calories for you.Get information about a proper diet from your health care provider, if necessary.  Regular physical exercise is one of the most important things you can do for your health. Most adults should get at least 150 minutes of moderate-intensity exercise (any activity that increases your heart rate and causes you to sweat) each week. In addition, most adults need muscle-strengthening exercises on 2 or more days a week.  Maintain a healthy weight. The body mass index (BMI) is a screening tool to identify possible weight problems. It provides an estimate of body fat based on height and weight. Your health care provider can find your BMI and can help you achieve or maintain a healthy weight.For adults 20 years and older:  A BMI below 18.5 is considered underweight.  A BMI of 18.5 to 24.9 is normal.  A BMI of 25 to 29.9 is considered overweight.  A BMI of  30 and above is considered obese.  Maintain normal blood lipids and cholesterol levels by exercising and minimizing your intake of saturated fat. Eat a balanced diet with plenty of fruit and vegetables. Blood tests for lipids and cholesterol should begin at age 76 and be repeated every 5 years. If your lipid or cholesterol levels are high, you are over 50, or you are at high risk for heart disease, you may need your cholesterol levels checked more frequently.Ongoing high lipid and cholesterol levels should be treated with medicines if diet and exercise are not working.  If you smoke, find out from your health care provider how to quit. If you do not use tobacco, do not start.  Lung cancer screening is recommended for adults aged 22-80 years who are at high risk for developing lung cancer because of a history of smoking. A yearly low-dose CT scan of the lungs is recommended for people who have at least a 30-pack-year history of smoking and are a current smoker or have quit within the past 15 years. A pack year of smoking is smoking an average of 1 pack of cigarettes a day for 1 year (for example: 1 pack a day for 30 years or 2 packs a day for 15 years). Yearly screening should continue until the smoker has stopped smoking for at least 15 years. Yearly screening should be stopped for people who develop a health problem that would prevent them from having lung cancer treatment.  If you are pregnant, do not drink alcohol. If you are breastfeeding,  be very cautious about drinking alcohol. If you are not pregnant and choose to drink alcohol, do not have more than 1 drink per day. One drink is considered to be 12 ounces (355 mL) of beer, 5 ounces (148 mL) of wine, or 1.5 ounces (44 mL) of liquor.  Avoid use of street drugs. Do not share needles with anyone. Ask for help if you need support or instructions about stopping the use of drugs.  High blood pressure causes heart disease and increases the risk of  stroke. Your blood pressure should be checked at least every 1 to 2 years. Ongoing high blood pressure should be treated with medicines if weight loss and exercise do not work.  If you are 3-86 years old, ask your health care provider if you should take aspirin to prevent strokes.  Diabetes screening involves taking a blood sample to check your fasting blood sugar level. This should be done once every 3 years, after age 67, if you are within normal weight and without risk factors for diabetes. Testing should be considered at a younger age or be carried out more frequently if you are overweight and have at least 1 risk factor for diabetes.  Breast cancer screening is essential preventive care for women. You should practice "breast self-awareness." This means understanding the normal appearance and feel of your breasts and may include breast self-examination. Any changes detected, no matter how small, should be reported to a health care provider. Women in their 8s and 30s should have a clinical breast exam (CBE) by a health care provider as part of a regular health exam every 1 to 3 years. After age 70, women should have a CBE every year. Starting at age 25, women should consider having a mammogram (breast X-ray test) every year. Women who have a family history of breast cancer should talk to their health care provider about genetic screening. Women at a high risk of breast cancer should talk to their health care providers about having an MRI and a mammogram every year.  Breast cancer gene (BRCA)-related cancer risk assessment is recommended for women who have family members with BRCA-related cancers. BRCA-related cancers include breast, ovarian, tubal, and peritoneal cancers. Having family members with these cancers may be associated with an increased risk for harmful changes (mutations) in the breast cancer genes BRCA1 and BRCA2. Results of the assessment will determine the need for genetic counseling and  BRCA1 and BRCA2 testing.  Routine pelvic exams to screen for cancer are no longer recommended for nonpregnant women who are considered low risk for cancer of the pelvic organs (ovaries, uterus, and vagina) and who do not have symptoms. Ask your health care provider if a screening pelvic exam is right for you.  If you have had past treatment for cervical cancer or a condition that could lead to cancer, you need Pap tests and screening for cancer for at least 20 years after your treatment. If Pap tests have been discontinued, your risk factors (such as having a new sexual partner) need to be reassessed to determine if screening should be resumed. Some women have medical problems that increase the chance of getting cervical cancer. In these cases, your health care provider may recommend more frequent screening and Pap tests.  The HPV test is an additional test that may be used for cervical cancer screening. The HPV test looks for the virus that can cause the cell changes on the cervix. The cells collected during the Pap test can be  tested for HPV. The HPV test could be used to screen women aged 30 years and older, and should be used in women of any age who have unclear Pap test results. After the age of 30, women should have HPV testing at the same frequency as a Pap test.  Colorectal cancer can be detected and often prevented. Most routine colorectal cancer screening begins at the age of 50 years and continues through age 75 years. However, your health care provider may recommend screening at an earlier age if you have risk factors for colon cancer. On a yearly basis, your health care provider may provide home test kits to check for hidden blood in the stool. Use of a small camera at the end of a tube, to directly examine the colon (sigmoidoscopy or colonoscopy), can detect the earliest forms of colorectal cancer. Talk to your health care provider about this at age 50, when routine screening begins. Direct  exam of the colon should be repeated every 5-10 years through age 75 years, unless early forms of pre-cancerous polyps or small growths are found.  People who are at an increased risk for hepatitis B should be screened for this virus. You are considered at high risk for hepatitis B if:  You were born in a country where hepatitis B occurs often. Talk with your health care provider about which countries are considered high risk.  Your parents were born in a high-risk country and you have not received a shot to protect against hepatitis B (hepatitis B vaccine).  You have HIV or AIDS.  You use needles to inject street drugs.  You live with, or have sex with, someone who has hepatitis B.  You get hemodialysis treatment.  You take certain medicines for conditions like cancer, organ transplantation, and autoimmune conditions.  Hepatitis C blood testing is recommended for all people born from 1945 through 1965 and any individual with known risks for hepatitis C.  Practice safe sex. Use condoms and avoid high-risk sexual practices to reduce the spread of sexually transmitted infections (STIs). STIs include gonorrhea, chlamydia, syphilis, trichomonas, herpes, HPV, and human immunodeficiency virus (HIV). Herpes, HIV, and HPV are viral illnesses that have no cure. They can result in disability, cancer, and death.  You should be screened for sexually transmitted illnesses (STIs) including gonorrhea and chlamydia if:  You are sexually active and are younger than 24 years.  You are older than 24 years and your health care provider tells you that you are at risk for this type of infection.  Your sexual activity has changed since you were last screened and you are at an increased risk for chlamydia or gonorrhea. Ask your health care provider if you are at risk.  If you are at risk of being infected with HIV, it is recommended that you take a prescription medicine daily to prevent HIV infection. This is  called preexposure prophylaxis (PrEP). You are considered at risk if:  You are a heterosexual woman, are sexually active, and are at increased risk for HIV infection.  You take drugs by injection.  You are sexually active with a partner who has HIV.  Talk with your health care provider about whether you are at high risk of being infected with HIV. If you choose to begin PrEP, you should first be tested for HIV. You should then be tested every 3 months for as long as you are taking PrEP.  Osteoporosis is a disease in which the bones lose minerals and strength   with aging. This can result in serious bone fractures or breaks. The risk of osteoporosis can be identified using a bone density scan. Women ages 65 years and over and women at risk for fractures or osteoporosis should discuss screening with their health care providers. Ask your health care provider whether you should take a calcium supplement or vitamin D to reduce the rate of osteoporosis.  Menopause can be associated with physical symptoms and risks. Hormone replacement therapy is available to decrease symptoms and risks. You should talk to your health care provider about whether hormone replacement therapy is right for you.  Use sunscreen. Apply sunscreen liberally and repeatedly throughout the day. You should seek shade when your shadow is shorter than you. Protect yourself by wearing long sleeves, pants, a wide-brimmed hat, and sunglasses year round, whenever you are outdoors.  Once a month, do a whole body skin exam, using a mirror to look at the skin on your back. Tell your health care provider of new moles, moles that have irregular borders, moles that are larger than a pencil eraser, or moles that have changed in shape or color.  Stay current with required vaccines (immunizations).  Influenza vaccine. All adults should be immunized every year.  Tetanus, diphtheria, and acellular pertussis (Td, Tdap) vaccine. Pregnant women should  receive 1 dose of Tdap vaccine during each pregnancy. The dose should be obtained regardless of the length of time since the last dose. Immunization is preferred during the 27th-36th week of gestation. An adult who has not previously received Tdap or who does not know her vaccine status should receive 1 dose of Tdap. This initial dose should be followed by tetanus and diphtheria toxoids (Td) booster doses every 10 years. Adults with an unknown or incomplete history of completing a 3-dose immunization series with Td-containing vaccines should begin or complete a primary immunization series including a Tdap dose. Adults should receive a Td booster every 10 years.  Varicella vaccine. An adult without evidence of immunity to varicella should receive 2 doses or a second dose if she has previously received 1 dose. Pregnant females who do not have evidence of immunity should receive the first dose after pregnancy. This first dose should be obtained before leaving the health care facility. The second dose should be obtained 4-8 weeks after the first dose.  Human papillomavirus (HPV) vaccine. Females aged 13-26 years who have not received the vaccine previously should obtain the 3-dose series. The vaccine is not recommended for use in pregnant females. However, pregnancy testing is not needed before receiving a dose. If a female is found to be pregnant after receiving a dose, no treatment is needed. In that case, the remaining doses should be delayed until after the pregnancy. Immunization is recommended for any person with an immunocompromised condition through the age of 26 years if she did not get any or all doses earlier. During the 3-dose series, the second dose should be obtained 4-8 weeks after the first dose. The third dose should be obtained 24 weeks after the first dose and 16 weeks after the second dose.  Zoster vaccine. One dose is recommended for adults aged 60 years or older unless certain conditions are  present.  Measles, mumps, and rubella (MMR) vaccine. Adults born before 1957 generally are considered immune to measles and mumps. Adults born in 1957 or later should have 1 or more doses of MMR vaccine unless there is a contraindication to the vaccine or there is laboratory evidence of immunity to   each of the three diseases. A routine second dose of MMR vaccine should be obtained at least 28 days after the first dose for students attending postsecondary schools, health care workers, or international travelers. People who received inactivated measles vaccine or an unknown type of measles vaccine during 1963-1967 should receive 2 doses of MMR vaccine. People who received inactivated mumps vaccine or an unknown type of mumps vaccine before 1979 and are at high risk for mumps infection should consider immunization with 2 doses of MMR vaccine. For females of childbearing age, rubella immunity should be determined. If there is no evidence of immunity, females who are not pregnant should be vaccinated. If there is no evidence of immunity, females who are pregnant should delay immunization until after pregnancy. Unvaccinated health care workers born before 1957 who lack laboratory evidence of measles, mumps, or rubella immunity or laboratory confirmation of disease should consider measles and mumps immunization with 2 doses of MMR vaccine or rubella immunization with 1 dose of MMR vaccine.  Pneumococcal 13-valent conjugate (PCV13) vaccine. When indicated, a person who is uncertain of her immunization history and has no record of immunization should receive the PCV13 vaccine. An adult aged 19 years or older who has certain medical conditions and has not been previously immunized should receive 1 dose of PCV13 vaccine. This PCV13 should be followed with a dose of pneumococcal polysaccharide (PPSV23) vaccine. The PPSV23 vaccine dose should be obtained at least 8 weeks after the dose of PCV13 vaccine. An adult aged 19  years or older who has certain medical conditions and previously received 1 or more doses of PPSV23 vaccine should receive 1 dose of PCV13. The PCV13 vaccine dose should be obtained 1 or more years after the last PPSV23 vaccine dose.  Pneumococcal polysaccharide (PPSV23) vaccine. When PCV13 is also indicated, PCV13 should be obtained first. All adults aged 65 years and older should be immunized. An adult younger than age 65 years who has certain medical conditions should be immunized. Any person who resides in a nursing home or long-term care facility should be immunized. An adult smoker should be immunized. People with an immunocompromised condition and certain other conditions should receive both PCV13 and PPSV23 vaccines. People with human immunodeficiency virus (HIV) infection should be immunized as soon as possible after diagnosis. Immunization during chemotherapy or radiation therapy should be avoided. Routine use of PPSV23 vaccine is not recommended for American Indians, [place] Natives, or people younger than 65 years unless there are medical conditions that require PPSV23 vaccine. When indicated, people who have unknown immunization and have no record of immunization should receive PPSV23 vaccine. One-time revaccination 5 years after the first dose of PPSV23 is recommended for people aged 19-64 years who have chronic kidney failure, nephrotic syndrome, asplenia, or immunocompromised conditions. People who received 1-2 doses of PPSV23 before age 65 years should receive another dose of PPSV23 vaccine at age 65 years or later if at least 5 years have passed since the previous dose. Doses of PPSV23 are not needed for people immunized with PPSV23 at or after age 65 years.  Meningococcal vaccine. Adults with asplenia or persistent complement component deficiencies should receive 2 doses of quadrivalent meningococcal conjugate (MenACWY-D) vaccine. The doses should be obtained at least 2 months apart.  Microbiologists working with certain meningococcal bacteria, military recruits, people at risk during an outbreak, and people who travel to or live in countries with a high rate of meningitis should be immunized. A first-year college student up through age   21 years who is living in a residence hall should receive a dose if she did not receive a dose on or after her 16th birthday. Adults who have certain high-risk conditions should receive one or more doses of vaccine.  Hepatitis A vaccine. Adults who wish to be protected from this disease, have certain high-risk conditions, work with hepatitis A-infected animals, work in hepatitis A research labs, or travel to or work in countries with a high rate of hepatitis A should be immunized. Adults who were previously unvaccinated and who anticipate close contact with an international adoptee during the first 60 days after arrival in the [place] from a country with a high rate of hepatitis A should be immunized.  Hepatitis B vaccine. Adults who wish to be protected from this disease, have certain high-risk conditions, may be exposed to blood or other infectious body fluids, are household contacts or sex partners of hepatitis B positive people, are clients or workers in certain care facilities, or travel to or work in countries with a high rate of hepatitis B should be immunized.  Haemophilus influenzae type b (Hib) vaccine. A previously unvaccinated person with asplenia or sickle cell disease or having a scheduled splenectomy should receive 1 dose of Hib vaccine. Regardless of previous immunization, a recipient of a hematopoietic stem cell transplant should receive a 3-dose series 6-12 months after her successful transplant. Hib vaccine is not recommended for adults with HIV infection. Preventive Services / Frequency Ages 64 to 68 years  Blood pressure check.** / Every 1 to 2 years.  Lipid and cholesterol check.** / Every 5 years beginning at age  22.  Clinical breast exam.** / Every 3 years for women in their 88s and 53s.  BRCA-related cancer risk assessment.** / For women who have family members with a BRCA-related cancer (breast, ovarian, tubal, or peritoneal cancers).  Pap test.** / Every 2 years from ages 90 through 51. Every 3 years starting at age 21 through age 56 or 3 with a history of 3 consecutive normal Pap tests.  HPV screening.** / Every 3 years from ages 24 through ages 1 to 46 with a history of 3 consecutive normal Pap tests.  Hepatitis C blood test.** / For any individual with known risks for hepatitis C.  Skin self-exam. / Monthly.  Influenza vaccine. / Every year.  Tetanus, diphtheria, and acellular pertussis (Tdap, Td) vaccine.** / Consult your health care provider. Pregnant women should receive 1 dose of Tdap vaccine during each pregnancy. 1 dose of Td every 10 years.  Varicella vaccine.** / Consult your health care provider. Pregnant females who do not have evidence of immunity should receive the first dose after pregnancy.  HPV vaccine. / 3 doses over 6 months, if 72 and younger. The vaccine is not recommended for use in pregnant females. However, pregnancy testing is not needed before receiving a dose.  Measles, mumps, rubella (MMR) vaccine.** / You need at least 1 dose of MMR if you were born in 1957 or later. You may also need a 2nd dose. For females of childbearing age, rubella immunity should be determined. If there is no evidence of immunity, females who are not pregnant should be vaccinated. If there is no evidence of immunity, females who are pregnant should delay immunization until after pregnancy.  Pneumococcal 13-valent conjugate (PCV13) vaccine.** / Consult your health care provider.  Pneumococcal polysaccharide (PPSV23) vaccine.** / 1 to 2 doses if you smoke cigarettes or if you have certain conditions.  Meningococcal vaccine.** /  1 dose if you are age 19 to 21 years and a first-year college  student living in a residence hall, or have one of several medical conditions, you need to get vaccinated against meningococcal disease. You may also need additional booster doses.  Hepatitis A vaccine.** / Consult your health care provider.  Hepatitis B vaccine.** / Consult your health care provider.  Haemophilus influenzae type b (Hib) vaccine.** / Consult your health care provider. Ages 40 to 64 years  Blood pressure check.** / Every 1 to 2 years.  Lipid and cholesterol check.** / Every 5 years beginning at age 20 years.  Lung cancer screening. / Every year if you are aged 55-80 years and have a 30-pack-year history of smoking and currently smoke or have quit within the past 15 years. Yearly screening is stopped once you have quit smoking for at least 15 years or develop a health problem that would prevent you from having lung cancer treatment.  Clinical breast exam.** / Every year after age 40 years.  BRCA-related cancer risk assessment.** / For women who have family members with a BRCA-related cancer (breast, ovarian, tubal, or peritoneal cancers).  Mammogram.** / Every year beginning at age 40 years and continuing for as long as you are in good health. Consult with your health care provider.  Pap test.** / Every 3 years starting at age 30 years through age 65 or 70 years with a history of 3 consecutive normal Pap tests.  HPV screening.** / Every 3 years from ages 30 years through ages 65 to 70 years with a history of 3 consecutive normal Pap tests.  Fecal occult blood test (FOBT) of stool. / Every year beginning at age 50 years and continuing until age 75 years. You may not need to do this test if you get a colonoscopy every 10 years.  Flexible sigmoidoscopy or colonoscopy.** / Every 5 years for a flexible sigmoidoscopy or every 10 years for a colonoscopy beginning at age 50 years and continuing until age 75 years.  Hepatitis C blood test.** / For all people born from 1945 through  1965 and any individual with known risks for hepatitis C.  Skin self-exam. / Monthly.  Influenza vaccine. / Every year.  Tetanus, diphtheria, and acellular pertussis (Tdap/Td) vaccine.** / Consult your health care provider. Pregnant women should receive 1 dose of Tdap vaccine during each pregnancy. 1 dose of Td every 10 years.  Varicella vaccine.** / Consult your health care provider. Pregnant females who do not have evidence of immunity should receive the first dose after pregnancy.  Zoster vaccine.** / 1 dose for adults aged 60 years or older.  Measles, mumps, rubella (MMR) vaccine.** / You need at least 1 dose of MMR if you were born in 1957 or later. You may also need a 2nd dose. For females of childbearing age, rubella immunity should be determined. If there is no evidence of immunity, females who are not pregnant should be vaccinated. If there is no evidence of immunity, females who are pregnant should delay immunization until after pregnancy.  Pneumococcal 13-valent conjugate (PCV13) vaccine.** / Consult your health care provider.  Pneumococcal polysaccharide (PPSV23) vaccine.** / 1 to 2 doses if you smoke cigarettes or if you have certain conditions.  Meningococcal vaccine.** / Consult your health care provider.  Hepatitis A vaccine.** / Consult your health care provider.  Hepatitis B vaccine.** / Consult your health care provider.  Haemophilus influenzae type b (Hib) vaccine.** / Consult your health care provider. Ages 65   years and over  Blood pressure check.** / Every 1 to 2 years.  Lipid and cholesterol check.** / Every 5 years beginning at age 22 years.  Lung cancer screening. / Every year if you are aged 73-80 years and have a 30-pack-year history of smoking and currently smoke or have quit within the past 15 years. Yearly screening is stopped once you have quit smoking for at least 15 years or develop a health problem that would prevent you from having lung cancer  treatment.  Clinical breast exam.** / Every year after age 4 years.  BRCA-related cancer risk assessment.** / For women who have family members with a BRCA-related cancer (breast, ovarian, tubal, or peritoneal cancers).  Mammogram.** / Every year beginning at age 40 years and continuing for as long as you are in good health. Consult with your health care provider.  Pap test.** / Every 3 years starting at age 9 years through age 34 or 91 years with 3 consecutive normal Pap tests. Testing can be stopped between 65 and 70 years with 3 consecutive normal Pap tests and no abnormal Pap or HPV tests in the past 10 years.  HPV screening.** / Every 3 years from ages 57 years through ages 64 or 45 years with a history of 3 consecutive normal Pap tests. Testing can be stopped between 65 and 70 years with 3 consecutive normal Pap tests and no abnormal Pap or HPV tests in the past 10 years.  Fecal occult blood test (FOBT) of stool. / Every year beginning at age 15 years and continuing until age 17 years. You may not need to do this test if you get a colonoscopy every 10 years.  Flexible sigmoidoscopy or colonoscopy.** / Every 5 years for a flexible sigmoidoscopy or every 10 years for a colonoscopy beginning at age 86 years and continuing until age 71 years.  Hepatitis C blood test.** / For all people born from [birth year] through 1964 and any individual with known risks for hepatitis C.  Osteoporosis screening.** / A one-time screening for women ages 83 years and over and women at risk for fractures or osteoporosis.  Skin self-exam. / Monthly.  Influenza vaccine. / Every year.  Tetanus, diphtheria, and acellular pertussis (Tdap/Td) vaccine.** / 1 dose of Td every 10 years.  Varicella vaccine.** / Consult your health care provider.  Zoster vaccine.** / 1 dose for adults aged 61 years or older.  Pneumococcal 13-valent conjugate (PCV13) vaccine.** / Consult your health care provider.  Pneumococcal  polysaccharide (PPSV23) vaccine.** / 1 dose for all adults aged 28 years and older.  Meningococcal vaccine.** / Consult your health care provider.  Hepatitis A vaccine.** / Consult your health care provider.  Hepatitis B vaccine.** / Consult your health care provider.  Haemophilus influenzae type b (Hib) vaccine.** / Consult your health care provider. ** Family history and personal history of risk and conditions may change your health care provider's recommendations. Document Released: 10/29/2001 Document Revised: 01/17/2014 Document Reviewed: 01/28/2011 [facility] Patient Information 2015 [place], [place]. This information is not intended to replace advice given to you by your health care provider. Make sure you discuss any questions you have with your health care provider.

## 2015-05-17 NOTE — Progress Notes (Signed)
Subjective:     Michelle Brown is a 24 y.o. female and is here for a comprehensive physical exam. The patient reports problems - ear pain and getting car sick as passenger.  Social History   Social History  . Marital Status: Single    Spouse Name: N/A  . Number of Children: N/A  . Years of Education: N/A   Occupational History  . Not on file.   Social History Main Topics  . Smoking status: Never Smoker   . Smokeless tobacco: Not on file  . Alcohol Use: No  . Drug Use: No  . Sexual Activity: Yes    Birth Control/ Protection: Condom   Other Topics Concern  . Not on file   Social History Narrative   Health Maintenance  Topic Date Due  . HIV Screening  09/28/2006  . INFLUENZA VACCINE  04/17/2015  . TETANUS/TDAP  04/02/2020    The following portions of the patient's history were reviewed and updated as appropriate:  She  has a past medical history of Acne and Urticaria. She  does not have any pertinent problems on file. She  has past surgical history that includes Tonsillectomy. Her family history includes Hypertension in an other family member; Leukemia in an other family member; Pancreatic cancer in an other family member. She  reports that she has never smoked. She does not have any smokeless tobacco history on file. She reports that she does not drink alcohol or use illicit drugs. She has a current medication list which includes the following prescription(s): ampicillin, levonorgestrel-ethinyl estradiol, methylphenidate, sertraline, and fluconazole. Current Outpatient Prescriptions on File Prior to Visit  Medication Sig Dispense Refill  . ampicillin (PRINCIPEN) 500 MG capsule Take 500 mg by mouth 2 (two) times daily.     Marland Kitchen levonorgestrel-ethinyl estradiol (AVIANE,ALESSE,LESSINA) 0.1-20 MG-MCG tablet Take 1 tablet by mouth daily. 1 Package 11  . methylphenidate (RITALIN) 20 MG tablet Take 20 mg by mouth 3 (three) times daily with meals.    . sertraline (ZOLOFT) 100 MG  tablet Take 100 mg by mouth daily.     . fluconazole (DIFLUCAN) 150 MG tablet Take 1 tablet (150 mg total) by mouth once. May repeat in 3 days prn (Patient not taking: Reported on 05/17/2015) 2 tablet 2   No current facility-administered medications on file prior to visit.   She is allergic to neosporin..  Review of Systems Review of Systems  Constitutional: Negative for activity change, appetite change and fatigue.  HENT: Negative for hearing loss, congestion, tinnitus and ear discharge.  dentist q37m Eyes: Negative for visual disturbance (see optho q1y -- vision corrected to 20/20 with glasses).  Respiratory: Negative for cough, chest tightness and shortness of breath.   Cardiovascular: Negative for chest pain, palpitations and leg swelling.  Gastrointestinal: Negative for abdominal pain, diarrhea, constipation and abdominal distention.  Genitourinary: Negative for urgency, frequency, decreased urine volume and difficulty urinating.  Musculoskeletal: Negative for back pain, arthralgias and gait problem.  Skin: Negative for color change, pallor and rash.  Neurological: Negative for dizziness, light-headedness, numbness and headaches.  Hematological: Negative for adenopathy. Does not bruise/bleed easily.  Psychiatric/Behavioral: Negative for suicidal ideas, confusion, sleep disturbance, self-injury, dysphoric mood, decreased concentration and agitation.       Objective:    BP 108/64 mmHg  Pulse 64  Temp(Src) 99.2 F (37.3 C) (Oral)  Resp 18  Ht 6' 0.5" (1.842 m)  Wt 175 lb 3.2 oz (79.47 kg)  BMI 23.42 kg/m2  SpO2 99%  LMP  05/08/2015 General appearance: alert, cooperative, appears stated age and no distress Head: Normocephalic, without obvious abnormality, atraumatic Eyes: conjunctivae/corneas clear. PERRL, EOM's intact. Fundi benign. Ears: normal TM's and external ear canals both ears Nose: Nares normal. Septum midline. Mucosa normal. No drainage or sinus tenderness. Throat:  lips, mucosa, and tongue normal; teeth and gums normal Neck: no adenopathy, no carotid bruit, no JVD, supple, symmetrical, trachea midline and thyroid not enlarged, symmetric, no tenderness/mass/nodules Back: symmetric, no curvature. ROM normal. No CVA tenderness. Lungs: clear to auscultation bilaterally Breasts: gyn Heart: regular rate and rhythm, S1, S2 normal, no murmur, click, rub or gallop Abdomen: soft, non-tender; bowel sounds normal; no masses,  no organomegaly Pelvic: deferred -gynExtremities: extremities normal, atraumatic, no cyanosis or edema Pulses: 2+ and symmetric Skin: Skin color, texture, turgor normal. No rashes or lesions Lymph nodes: Cervical, supraclavicular, and axillary nodes normal. Neurologic: Alert and oriented X 3, normal strength and tone. Normal symmetric reflexes. Normal coordination and gait Psych---no depression, no anxiety      Assessment:    Healthy female exam.       Plan:     ghm utd   See After Visit Summary for Counseling Recommendations   1. Encounter for initial prescription of contraceptive pills   - levonorgestrel-ethinyl estradiol (AVIANE,ALESSE,LESSINA) 0.1-20 MG-MCG tablet; Take 1 tablet by mouth daily.  Dispense: 1 Package; Refill: 11  2. Preventative health care   - Basic metabolic panel - CBC with Differential/Platelet - Hepatic function panel - Lipid panel - POCT urinalysis dipstick - TSH

## 2015-05-17 NOTE — Progress Notes (Signed)
Pre visit review using our clinic review tool, if applicable. No additional management support is needed unless otherwise documented below in the visit note. 

## 2015-11-14 ENCOUNTER — Telehealth: Payer: Self-pay | Admitting: Family Medicine

## 2015-11-14 NOTE — Telephone Encounter (Signed)
LVM inquiring if patient received flu shot  °

## 2016-04-10 ENCOUNTER — Other Ambulatory Visit: Payer: Self-pay | Admitting: Family Medicine

## 2016-04-10 DIAGNOSIS — Z30011 Encounter for initial prescription of contraceptive pills: Secondary | ICD-10-CM

## 2016-04-10 NOTE — Telephone Encounter (Signed)
Aviane refill sent to pharmacy, #[phone] x 1 refill. Pt last seen 05/17/15 and advised physical in 1 year. Appt not scheduled yet. Please contact pt and let her know we need to schedule her annual physical with Dr Laury Axon. Thanks!

## 2016-05-29 ENCOUNTER — Ambulatory Visit: Payer: BLUE CROSS/BLUE SHIELD | Attending: Otolaryngology

## 2016-05-29 DIAGNOSIS — R490 Dysphonia: Secondary | ICD-10-CM

## 2016-05-29 NOTE — Patient Instructions (Signed)
VOICE CONSERVATION PROGRAM  1. Avoid overuse of voice or excessive use of the voice . The vocal cords can become easily fatigued. Try to sort out what is "necessary" versus "unnecessary" talking in your environment. You do not need to STOP talking, but try to limit it as much as possible.  . Think of resting your voice just as long as you talk. For example, if you talk for 5 minutes, rest your voice completely for 5 minutes. . If your voice feels "tired" during the day, try to rest it as much as possible.  2. Avoid using an excessively loud voice or shouting/raising your voice . When you yell or raise your voice, the vocal cords slam into each other, much like a strong hand clap. This causes irritation, and if this irritation continues, hoarseness may increase. . If people in your home talk loudly, ask them to reduce the volume of their voices to help you decrease your volume as well. . Sit near or face the person to whom you are speaking.   3. Avoid talking over background noise . When talking in background noise, speech automatically has increased loudness, and as in number 2 above, continued loud speech can result in increased hoarseness due to irritation to the vocal cords.  . Do not talk over the radio or the TV. Mute them before speaking, go to a quieter place to talk, or sit next to the person with whom you are watching.  4. Talk in a voice that is soft, smooth, and gentle . This allows the vocal cords to come together in a gentle way and allows the air being exhaled from the lungs to do most/all of the work when you are speaking.  5. Avoid excessive throat clearing, coughing, and loud laughter . During these activities the vocal cords can also be slammed together in a hard way and foster irritation or swelling, which can cause hoarseness. . Try taking sips of room temperature/cool water with hard swallows to clear secretions from the throat, instead of throat clearing or  coughing. . If you absolutely must clear your throat, do so as gently as possible. If you find yourself clearing your throat or coughing a lot, consult your physician. . You will want to laugh. Continue to do so, but softly and gently. Do not laugh loudly for long periods of time because this can increase the chances for vocal fold irritation and thus hoarseness. . Lozenges can help reduce the need to clear throat/cough during cold/allergy season.  6. Keep the mouth and throat lubricated . Drink at least 8-10 8 oz. glasses of water per day (64-80 oz.). This water can come in the form of drink mixes.  . Caffeinated beverages such as colas, coffee, and tea (hot tea AND sweet tea) dry out the vocal cords and then can cause irritation and thus hoarseness.  . Drinking water will keep your body hydrated. This will help to decrease secretions in the throat, which cause people to clear their throats or cough. . If you are exercising outside (especially during drier weather), try to breathe through your nose as much as possible. If this is not possible, lifting the tongue up behind the upper teeth when breathing through the mouth will add some moisture to the air breathed in past the vocal cords.  7. Avoid mouth breathing - breathe through your nose . Your nose serves as a natural filter for dust and dirt particles from the air, and as a humidifier to   moisten the air. Vocal cords like to work in moistened, filtered air. When you breathe through your mouth you lose the air filtering and moistening benefits of breathing through the nose. . Be aware of breathing patterns when sitting quietly (e.g., reading or watching TV). Increase your awareness and try to change habits from mouth breathing to nose breathing during those times.  8. Avoid environmental and/or ingested irritants . Try to avoid smoke-filled and or dusty environments. These items dry out the vocal cords and cause irritation.  9. Use an air filter  if the home is dusty, and/or a humidifier if the air is dry  . This will help to maintain clean, humid air to breathe. Remember, this type of air is what the vocal cords like best.  

## 2016-05-29 NOTE — Therapy (Signed)
[facility] [address] [place], [place], [address] Phone: [phone]   Fax:  [phone]  Speech Language Pathology Evaluation  Patient Details  Name: Michelle Brown MRN: [id number] Date of Birth: Apr 08, 1991 Referring Provider: Dillard CannonNewman, Christopher, M.D.  Encounter Date: 05/29/2016      End of Session - 05/29/16 1719    Visit Number 1   Number of Visits 1   Date for SLP Re-Evaluation 05/29/16   SLP Start Time 1018   SLP Stop Time  1100   SLP Time Calculation (min) 42 min   Activity Tolerance Patient tolerated treatment well      Past Medical History:  Diagnosis Date  . Acne   . Urticaria     Past Surgical History:  Procedure Laterality Date  . TONSILLECTOMY      There were no vitals filed for this visit.      Subjective Assessment - 05/29/16 1056    Subjective "I'm a loud talker, and I love to talk."   Currently in Pain? No/denies            SLP Evaluation [facility] - 05/29/16 1056      SLP Visit Information   SLP Received On 05/29/16   Referring Provider Dillard CannonNewman, Christopher, M.D.   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 She self instituted voice rest before a presentation, but afterwards has noticed mild hoarseness. 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      Prior Functional Status   Cognitive/Linguistic Baseline Within functional limits     Cognition   Overall Cognitive Status Within Functional Limits for tasks assessed     Auditory Comprehension   Overall Auditory Comprehension Appears within functional limits for tasks assessed     Verbal Expression   Overall Verbal Expression Appears within functional limits for tasks assessed      Oral Motor/Sensory Function   Overall Oral Motor/Sensory Function Appears within functional limits for tasks assessed     Motor Speech   Phonation Hoarse  mild - pt rated voice 9/10 (10=WNL voice)   Phonation Impaired   Vocal Abuses Habitual Hyperphonia;Prolonged Vocal Use;Vocal Fold Dehydration   Volume Loud  (via SLP perception)   Pitch --  vocal fry present in 75% utterances-spontaneous speech                         SLP Education - 05/29/16 1719    Education provided Yes   Education Details voice hygiene points from handout, confidential voice, vocal "bank account"   Person(s) Educated Patient   Methods Explanation;Handout;Demonstration;Verbal cues   Comprehension Verbalized understanding;Returned demonstration;Verbal cues required              Plan - 05/29/16 1720    Clinical Impression Statement Pt presents today with mild hoarseness, and is a chest breather as demonstrated in conversation today with SLP. Pt admits to enjoying talking as well as talking loudly, as well as using voice very often either during work or in the office. Additionally, pt drinks approx 48 oz caffeinated beverages and approx 60oz H2O daily. She has pain around her laryngeal area occasionally in the mornings. Her s/z ratio  is .92, WNL, and her max phonation time is 14 seconds (WFL/WNL). SLP explained to pt that her history is of vocal misuse/overuse/abuse, and this continued behavior is negatively affecting her ability to achieve consistent voicing. 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    Speech Therapy Frequency One time visit   Consulted and Agree with Plan of Care Patient      Patient will benefit from skilled therapeutic intervention in order to improve the  following deficits and impairments:   Hoarseness    Problem List Patient Active Problem List   Diagnosis Date Noted  . Numbness of legs 08/27/2012  . CALF PAIN, LEFT 08/22/2010  . ACNE VULGARIS, FACIAL 03/12/2010  . ACUTE PHARYNGITIS 11/14/2009  . ADD 10/27/2009  . SINUSITIS - ACUTE-NOS 12/27/2008    [facility] ,MS, CCC-SLP  05/29/2016, 5:27 PM  [facility] [address] [place], [place], [address] Phone: [phone]   Fax:  [phone]  Name: Michelle Brown MRN: [id number] Date of Birth: 11-24-1990

## 2016-05-31 ENCOUNTER — Telehealth: Payer: Self-pay | Admitting: Family Medicine

## 2016-05-31 DIAGNOSIS — Z30011 Encounter for initial prescription of contraceptive pills: Secondary | ICD-10-CM

## 2016-05-31 MED ORDER — LEVONORGESTREL-ETHINYL ESTRAD 0.1-20 MG-MCG PO TABS: 1.0000 | ORAL_TABLET | Freq: Every day | ORAL | 0 refills | Status: DC

## 2016-05-31 NOTE — Telephone Encounter (Signed)
Refill request for AVIANE 0.1-20 MG-MCG tablet. Pt says that she is going [place] the [place] and would like to have today if possible.   Pharmacy: [facility]

## 2016-08-20 ENCOUNTER — Other Ambulatory Visit: Payer: Self-pay | Admitting: Family Medicine

## 2016-08-20 DIAGNOSIS — Z30011 Encounter for initial prescription of contraceptive pills: Secondary | ICD-10-CM

## 2016-08-20 NOTE — Telephone Encounter (Signed)
eScribe request from [facility] for refill on Vienva Oral Contraceptive Last filled - 05/31/16, #3 packages x 0 Last AEX - 05/17/15 Next AEX - Pt advised physical in 1 year. Appt not scheduled yet.  High Contraindication Drug Warning: for pregnancy, patient pregnancy status is unknown; X-known fetal risks outweighs benefits. Please Advise on refills/SLS 12/05

## 2016-11-06 ENCOUNTER — Other Ambulatory Visit: Payer: Self-pay | Admitting: Family Medicine

## 2016-11-06 DIAGNOSIS — Z30011 Encounter for initial prescription of contraceptive pills: Secondary | ICD-10-CM

## 2016-11-07 NOTE — Telephone Encounter (Signed)
Patient needs CPE last one 04/2015  thx pc

## 2016-11-08 NOTE — Telephone Encounter (Signed)
Left message on VM for patient to call the office to schedule CPE

## 2016-11-25 ENCOUNTER — Encounter: Payer: Self-pay | Admitting: Family Medicine

## 2016-11-26 ENCOUNTER — Other Ambulatory Visit: Payer: Self-pay | Admitting: Family Medicine

## 2016-11-26 DIAGNOSIS — Z309 Encounter for contraceptive management, unspecified: Secondary | ICD-10-CM

## 2016-11-26 NOTE — Telephone Encounter (Signed)
We do not do IUDs but can refer her to gyn---  Let referral coord know she is home week of march 26

## 2017-01-27 ENCOUNTER — Other Ambulatory Visit: Payer: Self-pay | Admitting: Family Medicine

## 2017-01-27 DIAGNOSIS — Z30011 Encounter for initial prescription of contraceptive pills: Secondary | ICD-10-CM

## 2017-03-14 ENCOUNTER — Encounter: Payer: Self-pay | Admitting: Family Medicine

## 2017-03-14 ENCOUNTER — Ambulatory Visit (INDEPENDENT_AMBULATORY_CARE_PROVIDER_SITE_OTHER): Payer: BLUE CROSS/BLUE SHIELD | Admitting: Family Medicine

## 2017-03-14 VITALS — BP 108/70 | HR 73 | Temp 98.9°F | Resp 16 | Ht 71.5 in | Wt 209.8 lb

## 2017-03-14 DIAGNOSIS — Z0000 Encounter for general adult medical examination without abnormal findings: Secondary | ICD-10-CM

## 2017-03-14 DIAGNOSIS — Z30018 Encounter for initial prescription of other contraceptives: Secondary | ICD-10-CM

## 2017-03-14 DIAGNOSIS — R319 Hematuria, unspecified: Secondary | ICD-10-CM | POA: Diagnosis not present

## 2017-03-14 LAB — POC URINALSYSI DIPSTICK (AUTOMATED)
BILIRUBIN UA: NEGATIVE
GLUCOSE UA: NEGATIVE
Ketones, UA: NEGATIVE
LEUKOCYTES UA: NEGATIVE
Nitrite, UA: NEGATIVE
PH UA: 8 (ref 5.0–8.0)
Protein, UA: NEGATIVE
Spec Grav, UA: 1.015 (ref 1.010–1.025)
Urobilinogen, UA: 0.2 E.U./dL

## 2017-03-14 LAB — CBC WITH DIFFERENTIAL/PLATELET
BASOS ABS: 0 10*3/uL (ref 0.0–0.1)
Basophils Relative: 0.5 % (ref 0.0–3.0)
EOS ABS: 0.1 10*3/uL (ref 0.0–0.7)
Eosinophils Relative: 1.8 % (ref 0.0–5.0)
HEMATOCRIT: 40.2 % (ref 36.0–46.0)
Hemoglobin: 13.5 g/dL (ref 12.0–15.0)
LYMPHS PCT: 31.5 % (ref 12.0–46.0)
Lymphs Abs: 1.8 10*3/uL (ref 0.7–4.0)
MCHC: 33.6 g/dL (ref 30.0–36.0)
MCV: 91.9 fl (ref 78.0–100.0)
MONO ABS: 0.3 10*3/uL (ref 0.1–1.0)
Monocytes Relative: 6.1 % (ref 3.0–12.0)
NEUTROS ABS: 3.4 10*3/uL (ref 1.4–7.7)
NEUTROS PCT: 60.1 % (ref 43.0–77.0)
PLATELETS: 230 10*3/uL (ref 150.0–400.0)
RBC: 4.38 Mil/uL (ref 3.87–5.11)
RDW: 12.9 % (ref 11.5–15.5)
WBC: 5.6 10*3/uL (ref 4.0–10.5)

## 2017-03-14 LAB — COMPREHENSIVE METABOLIC PANEL
ALBUMIN: 4.2 g/dL (ref 3.5–5.2)
ALT: 13 U/L (ref 0–35)
AST: 18 U/L (ref 0–37)
Alkaline Phosphatase: 51 U/L (ref 39–117)
BILIRUBIN TOTAL: 0.5 mg/dL (ref 0.2–1.2)
BUN: 10 mg/dL (ref 6–23)
CALCIUM: 9 mg/dL (ref 8.4–10.5)
CO2: 28 meq/L (ref 19–32)
CREATININE: 0.75 mg/dL (ref 0.40–1.20)
Chloride: 104 mEq/L (ref 96–112)
GFR: 99.7 mL/min (ref >60.00)
Glucose, Bld: 98 mg/dL (ref 70–99)
Potassium: 4 mEq/L (ref 3.5–5.1)
SODIUM: 138 meq/L (ref 135–145)
Total Protein: 7.3 g/dL (ref 6.0–8.3)

## 2017-03-14 LAB — TSH: TSH: 1.08 u[IU]/mL (ref 0.35–4.50)

## 2017-03-14 LAB — LIPID PANEL
CHOL/HDL RATIO: 3
CHOLESTEROL: 212 mg/dL — AB (ref 0–200)
HDL: 66.7 mg/dL (ref >39.00)
LDL Cholesterol: 131 mg/dL — ABNORMAL HIGH (ref 0–99)
NonHDL: 145.43
TRIGLYCERIDES: 72 mg/dL (ref 0.0–149.0)
VLDL: 14.4 mg/dL (ref 0.0–40.0)

## 2017-03-14 NOTE — Progress Notes (Signed)
Subjective:   I acted as a Neurosurgeon for Dr. Zola Button.  Apolonio Schneiders, CMA   Michelle Brown is a 26 y.o. female and is here for a comprehensive physical exam. The patient reports no problems.  She would like referral to discuss getting and IUD.  Social History   Social History  . Marital status: Single    Spouse name: N/A  . Number of children: N/A  . Years of education: N/A   Occupational History  . Not on file.   Social History Main Topics  . Smoking status: Never Smoker  . Smokeless tobacco: Never Used  . Alcohol use No  . Drug use: No  . Sexual activity: Yes    Birth control/ protection: Condom   Other Topics Concern  . Not on file   Social History Narrative   Exercise as she can   Health Maintenance  Topic Date Due  . HIV Screening  09/28/2006  . INFLUENZA VACCINE  04/16/2017  . PAP SMEAR  04/25/2017  . TETANUS/TDAP  04/02/2020    The following portions of the patient's history were reviewed and updated as appropriate:  She  has a past medical history of Acne and Urticaria. She  does not have any pertinent problems on file. She  has a past surgical history that includes Tonsillectomy. Her family history is not on file. She  reports that she has never smoked. She has never used smokeless tobacco. She reports that she does not drink alcohol or use drugs. She has a current medication list which includes the following prescription(s): levonorgestrel-ethinyl estradiol, methylphenidate, and sertraline. Current Outpatient Prescriptions on File Prior to Visit  Medication Sig Dispense Refill  . levonorgestrel-ethinyl estradiol (AVIANE,ALESSE,LESSINA) 0.1-20 MG-MCG tablet TAKE 1 TABLET BY MOUTH DAILY. 84 tablet 0  . methylphenidate (RITALIN) 20 MG tablet Take 20 mg by mouth 3 (three) times daily with meals.    . sertraline (ZOLOFT) 100 MG tablet Take 100 mg by mouth daily.      No current facility-administered medications on file prior to visit.    She is allergic to neosporin  [neomycin-bacitracin zn-polymyx]..  Review of Systems Review of Systems  Constitutional: Negative for activity change, appetite change and fatigue.  HENT: Negative for hearing loss, congestion, tinnitus and ear discharge.  dentist q51m Eyes: Negative for visual disturbance (see optho q1y -- vision corrected to 20/20 with glasses).  Respiratory: Negative for cough, chest tightness and shortness of breath.   Cardiovascular: Negative for chest pain, palpitations and leg swelling.  Gastrointestinal: Negative for abdominal pain, diarrhea, constipation and abdominal distention.  Genitourinary: Negative for urgency, frequency, decreased urine volume and difficulty urinating.  Musculoskeletal: Negative for back pain, arthralgias and gait problem.  Skin: Negative for color change, pallor and rash.  Neurological: Negative for dizziness, light-headedness, numbness and headaches.  Hematological: Negative for adenopathy. Does not bruise/bleed easily.  Psychiatric/Behavioral: Negative for suicidal ideas, confusion, sleep disturbance, self-injury, dysphoric mood, decreased concentration and agitation.       Objective:    BP 108/70 (BP Location: Left Arm, Cuff Size: Normal)   Pulse 73   Temp 98.9 F (37.2 C) (Oral)   Resp 16   Ht 5' 11.5" (1.816 m)   Wt 209 lb 12.8 oz (95.2 kg)   LMP 03/14/2017   SpO2 98%   BMI 28.85 kg/m  General appearance: alert, cooperative, appears stated age and no distress Head: Normocephalic, without obvious abnormality, atraumatic Eyes: conjunctivae/corneas clear. PERRL, EOM's intact. Fundi benign. Ears: normal TM's and external ear  canals both ears Nose: Nares normal. Septum midline. Mucosa normal. No drainage or sinus tenderness. Throat: lips, mucosa, and tongue normal; teeth and gums normal Neck: no adenopathy, no carotid bruit, no JVD, supple, symmetrical, trachea midline and thyroid not enlarged, symmetric, no tenderness/mass/nodules Back: symmetric, no  curvature. ROM normal. No CVA tenderness. Lungs: clear to auscultation bilaterally Breasts: gyn Heart: regular rate and rhythm, S1, S2 normal, no murmur, click, rub or gallop Abdomen: soft, non-tender; bowel sounds normal; no masses,  no organomegaly Pelvic: deferred---gyn Extremities: extremities normal, atraumatic, no cyanosis or edema Pulses: 2+ and symmetric Skin: Skin color, texture, turgor normal. No rashes or lesions Lymph nodes: Cervical, supraclavicular, and axillary nodes normal. Neurologic: Alert and oriented X 3, normal strength and tone. Normal symmetric reflexes. Normal coordination and gait    Assessment:    Healthy female exam.      Plan:    ghm utd Check labs See After Visit Summary for Counseling Recommendations    1. Preventative health care See above - POCT Urinalysis Dipstick (Automated) - Comprehensive metabolic panel - CBC with Differential/Platelet - Lipid panel - TSH  2. Encounter for initial prescription of other contraceptives Pt would like to get an IUD - Ambulatory referral to Gynecology

## 2017-03-14 NOTE — Patient Instructions (Signed)
Preventive Care 18-39 Years, Female Preventive care refers to lifestyle choices and visits with your health care provider that can promote health and wellness. What does preventive care include?  A yearly physical exam. This is also called an annual well check.  Dental exams once or twice a year.  Routine eye exams. Ask your health care provider how often you should have your eyes checked.  Personal lifestyle choices, including: ? Daily care of your teeth and gums. ? Regular physical activity. ? Eating a healthy diet. ? Avoiding tobacco and drug use. ? Limiting alcohol use. ? Practicing safe sex. ? Taking vitamin and mineral supplements as recommended by your health care provider. What happens during an annual well check? The services and screenings done by your health care provider during your annual well check will depend on your age, overall health, lifestyle risk factors, and family history of disease. Counseling Your health care provider may ask you questions about your:  Alcohol use.  Tobacco use.  Drug use.  Emotional well-being.  Home and relationship well-being.  Sexual activity.  Eating habits.  Work and work Statistician.  Method of birth control.  Menstrual cycle.  Pregnancy history.  Screening You may have the following tests or measurements:  Height, weight, and BMI.  Diabetes screening. This is done by checking your blood sugar (glucose) after you have not eaten for a while (fasting).  Blood pressure.  Lipid and cholesterol levels. These may be checked every 5 years starting at age 66.  Skin check.  Hepatitis C blood test.  Hepatitis B blood test.  Sexually transmitted disease (STD) testing.  BRCA-related cancer screening. This may be done if you have a family history of breast, ovarian, tubal, or peritoneal cancers.  Pelvic exam and Pap test. This may be done every 3 years starting at age 40. Starting at age 59, this may be done every 5  years if you have a Pap test in combination with an HPV test.  Discuss your test results, treatment options, and if necessary, the need for more tests with your health care provider. Vaccines Your health care provider may recommend certain vaccines, such as:  Influenza vaccine. This is recommended every year.  Tetanus, diphtheria, and acellular pertussis (Tdap, Td) vaccine. You may need a Td booster every 10 years.  Varicella vaccine. You may need this if you have not been vaccinated.  HPV vaccine. If you are 69 or younger, you may need three doses over 6 months.  Measles, mumps, and rubella (MMR) vaccine. You may need at least one dose of MMR. You may also need a second dose.  Pneumococcal 13-valent conjugate (PCV13) vaccine. You may need this if you have certain conditions and were not previously vaccinated.  Pneumococcal polysaccharide (PPSV23) vaccine. You may need one or two doses if you smoke cigarettes or if you have certain conditions.  Meningococcal vaccine. One dose is recommended if you are age 27-21 years and a first-year college student living in a residence hall, or if you have one of several medical conditions. You may also need additional booster doses.  Hepatitis A vaccine. You may need this if you have certain conditions or if you travel or work in places where you may be exposed to hepatitis A.  Hepatitis B vaccine. You may need this if you have certain conditions or if you travel or work in places where you may be exposed to hepatitis B.  Haemophilus influenzae type b (Hib) vaccine. You may need this if  you have certain risk factors.  Talk to your health care provider about which screenings and vaccines you need and how often you need them. This information is not intended to replace advice given to you by your health care provider. Make sure you discuss any questions you have with your health care provider. Document Released: 10/29/2001 Document Revised: 05/22/2016  Document Reviewed: 07/04/2015 Elsevier Interactive Patient Education  2017 [employer].

## 2017-03-15 LAB — URINE CULTURE

## 2017-03-28 ENCOUNTER — Encounter: Payer: Self-pay | Admitting: Obstetrics & Gynecology

## 2017-03-28 ENCOUNTER — Ambulatory Visit (INDEPENDENT_AMBULATORY_CARE_PROVIDER_SITE_OTHER): Payer: BLUE CROSS/BLUE SHIELD | Admitting: Obstetrics & Gynecology

## 2017-03-28 VITALS — BP 120/78 | Ht 71.5 in | Wt 202.0 lb

## 2017-03-28 DIAGNOSIS — Z113 Encounter for screening for infections with a predominantly sexual mode of transmission: Secondary | ICD-10-CM

## 2017-03-28 DIAGNOSIS — Z3009 Encounter for other general counseling and advice on contraception: Secondary | ICD-10-CM | POA: Diagnosis not present

## 2017-03-28 DIAGNOSIS — Z01419 Encounter for gynecological examination (general) (routine) without abnormal findings: Secondary | ICD-10-CM

## 2017-03-28 NOTE — Patient Instructions (Signed)
1. Encounter for routine gynecological examination with Papanicolaou smear of cervix Normal Gyn exam.  Pap reflex done.  Breasts wnl.  2. Encounter for other general counseling or advice on contraception Will continue on Aviane until done with [facility] exams.  F/U Mirena IUD insertion under US guidance (if covered).  Counseling on Progestin IUD, insertion/Risks/Benefits reviewed.  Mirena and [employer] given.  Casady, it was a pleasure to meet you today!  I will inform you of your results as soon as available.  See you soon for IUD insertion.  Levonorgestrel intrauterine device (IUD) What is this medicine? LEVONORGESTREL IUD (LEE voe nor jes trel) is a contraceptive (birth control) device. The device is placed inside the uterus by a healthcare professional. It is used to prevent pregnancy. This device can also be used to treat heavy bleeding that occurs during your period. This medicine may be used for other purposes; ask your health care provider or pharmacist if you have questions. COMMON BRAND NAME(S): Cameron Ali What should I tell my health care provider before I take this medicine? They need to know if you have any of these conditions: -abnormal Pap smear -cancer of the breast, uterus, or cervix -diabetes -endometritis -genital or pelvic infection now or in the past -have more than one sexual partner or your partner has more than one partner -heart disease -history of an ectopic or tubal pregnancy -immune system problems -IUD in place -liver disease or tumor -problems with blood clots or take blood-thinners -seizures -use intravenous drugs -uterus of unusual shape -vaginal bleeding that has not been explained -an unusual or allergic reaction to levonorgestrel, other hormones, silicone, or polyethylene, medicines, foods, dyes, or preservatives -pregnant or trying to get pregnant -breast-feeding How should I use this medicine? This device is placed inside  the uterus by a health care professional. Talk to your pediatrician regarding the use of this medicine in children. Special care may be needed. Overdosage: If you think you have taken too much of this medicine contact a poison control center or emergency room at once. NOTE: This medicine is only for you. Do not share this medicine with others. What if I miss a dose? This does not apply. Depending on the brand of device you have inserted, the device will need to be replaced every 3 to 5 years if you wish to continue using this type of birth control. What may interact with this medicine? Do not take this medicine with any of the following medications: -amprenavir -bosentan -fosamprenavir This medicine may also interact with the following medications: -aprepitant -armodafinil -barbiturate medicines for inducing sleep or treating seizures -bexarotene -boceprevir -griseofulvin -medicines to treat seizures like carbamazepine, ethotoin, felbamate, oxcarbazepine, phenytoin, topiramate -modafinil -pioglitazone -rifabutin -rifampin -rifapentine -some medicines to treat HIV infection like atazanavir, efavirenz, indinavir, lopinavir, nelfinavir, tipranavir, ritonavir -St. John's wort -warfarin This list may not describe all possible interactions. Give your health care provider a list of all the medicines, herbs, non-prescription drugs, or dietary supplements you use. Also tell them if you smoke, drink alcohol, or use illegal drugs. Some items may interact with your medicine. What should I watch for while using this medicine? Visit your doctor or health care professional for regular check ups. See your doctor if you or your partner has sexual contact with others, becomes HIV positive, or gets a sexual transmitted disease. This product does not protect you against HIV infection (AIDS) or other sexually transmitted diseases. You can check the placement of the IUD yourself by  reaching up to the top of  your vagina with clean fingers to feel the threads. Do not pull on the threads. It is a good habit to check placement after each menstrual period. Call your doctor right away if you feel more of the IUD than just the threads or if you cannot feel the threads at all. The IUD may come out by itself. You may become pregnant if the device comes out. If you notice that the IUD has come out use a backup birth control method like condoms and call your health care provider. Using tampons will not change the position of the IUD and are okay to use during your period. This IUD can be safely scanned with magnetic resonance imaging (MRI) only under specific conditions. Before you have an MRI, tell your healthcare provider that you have an IUD in place, and which type of IUD you have in place. What side effects may I notice from receiving this medicine? Side effects that you should report to your doctor or health care professional as soon as possible: -allergic reactions like skin rash, itching or hives, swelling of the face, lips, or tongue -fever, flu-like symptoms -genital sores -high blood pressure -no menstrual period for 6 weeks during use -pain, swelling, warmth in the leg -pelvic pain or tenderness -severe or sudden headache -signs of pregnancy -stomach cramping -sudden shortness of breath -trouble with balance, talking, or walking -unusual vaginal bleeding, discharge -yellowing of the eyes or skin Side effects that usually do not require medical attention (report to your doctor or health care professional if they continue or are bothersome): -acne -breast pain -change in sex drive or performance -changes in weight -cramping, dizziness, or faintness while the device is being inserted -headache -irregular menstrual bleeding within first 3 to 6 months of use -nausea This list may not describe all possible side effects. Call your doctor for medical advice about side effects. You may report side  effects to FDA at 1-800-FDA-1088. Where should I keep my medicine? This does not apply. NOTE: This sheet is a summary. It may not cover all possible information. If you have questions about this medicine, talk to your doctor, pharmacist, or health care provider.  2018 Elsevier/Gold Standard (2016-06-14 14:14:56)

## 2017-03-28 NOTE — Addendum Note (Signed)
Addended by: Richardson ChiquitoWILKINSON, Ast[place] Condon S on: 03/28/2017 12:18 PM   Modules accepted: Orders

## 2017-03-28 NOTE — Progress Notes (Signed)
    Michelle Brown 12-May-1991 [id number]   History:    26 y.o. G0 Stable boyfriend.  Graduated from [employer].  [facility] exams in 2 weeks.  RP:  New patient presenting for annual gyn exam   HPI:  Well on Aviane 1/20, but would like to switch to Progestin IUD after her exams.  No pelvic pain.  No abnormal bleeding, only rare mild BTB.  Normal vaginal secretions.  Breasts wnl.  Mictions/BMs wnl.  Past medical history,surgical history, family history and social history were all reviewed and documented in the EPIC chart.  Gynecologic History Patient's last menstrual period was 03/14/2017. Contraception: OCP (estrogen/progesterone) Last Pap: 04/2014. Results were: normal Last mammogram: Never.   Obstetric History OB History  Gravida Para Term Preterm AB Living  0 0 0 0 0 0  SAB TAB Ectopic Multiple Live Births  0 0 0 0 0         ROS: A ROS was performed and pertinent positives and negatives are included in the history.  GENERAL: No fevers or chills. HEENT: No change in vision, no earache, sore throat or sinus congestion. NECK: No pain or stiffness. CARDIOVASCULAR: No chest pain or pressure. No palpitations. PULMONARY: No shortness of breath, cough or wheeze. GASTROINTESTINAL: No abdominal pain, nausea, vomiting or diarrhea, melena or bright red blood per rectum. GENITOURINARY: No urinary frequency, urgency, hesitancy or dysuria. MUSCULOSKELETAL: No joint or muscle pain, no back pain, no recent trauma. DERMATOLOGIC: No rash, no itching, no lesions. ENDOCRINE: No polyuria, polydipsia, no heat or cold intolerance. No recent change in weight. HEMATOLOGICAL: No anemia or easy bruising or bleeding. NEUROLOGIC: No headache, seizures, numbness, tingling or weakness. PSYCHIATRIC: No depression, no loss of interest in normal activity or change in sleep pattern.     Exam:   BP 120/78   Ht 5' 11.5" (1.816 m)   Wt 202 lb (91.6 kg)   LMP 03/14/2017   BMI 27.78 kg/m   Body mass index is  27.78 kg/m.  General appearance : Well developed well nourished female. No acute distress HEENT: Eyes: no retinal hemorrhage or exudates,  Neck supple, trachea midline, no carotid bruits, no thyroidmegaly Lungs: Clear to auscultation, no rhonchi or wheezes, or rib retractions  Heart: Regular rate and rhythm, no murmurs or gallops Breast:Examined in sitting and supine position were symmetrical in appearance, no palpable masses or tenderness,  no skin retraction, no nipple inversion, no nipple discharge, no skin discoloration, no axillary or supraclavicular lymphadenopathy Abdomen: no palpable masses or tenderness, no rebound or guarding Extremities: no edema or skin discoloration or tenderness  Pelvic:  Bartholin, Urethra, Skene Glands: Within normal limits             Vagina: No gross lesions or discharge  Cervix: No gross lesions or discharge.  Pap/Gono-Chlam done.  Uterus  AV, normal size, shape and consistency, non-tender and mobile  Adnexa  Without masses or tenderness  Anus and perineum  normal    Assessment/Plan:  26 y.o. female for annual exam   1. Encounter for routine gynecological examination with Papanicolaou smear of cervix Normal Gyn exam.  Pap reflex done.  Breasts wnl.  2. Encounter for other general counseling or advice on contraception [facility]  F/U Mirena IUD insertion under US guidance (if covered).  Counseling on Progestin IUD, insertion/Risks/Benefits reviewed.  Mirena and [employer] pamphlets given.  Genia DelMarie-Lyne Dewain Platz MD, 10:56 AM 03/28/2017

## 2017-04-01 LAB — PAP IG, CT-NG, RFX HPV ASCU
Chlamydia Probe Amp: NOT DETECTED
GC PROBE AMP: NOT DETECTED

## 2017-04-04 ENCOUNTER — Encounter: Payer: Self-pay | Admitting: *Deleted

## 2017-04-29 ENCOUNTER — Other Ambulatory Visit: Payer: Self-pay | Admitting: Obstetrics & Gynecology

## 2017-04-29 DIAGNOSIS — Z30430 Encounter for insertion of intrauterine contraceptive device: Secondary | ICD-10-CM

## 2017-05-07 ENCOUNTER — Other Ambulatory Visit: Payer: BLUE CROSS/BLUE SHIELD

## 2017-05-07 ENCOUNTER — Ambulatory Visit: Payer: BLUE CROSS/BLUE SHIELD | Admitting: Obstetrics & Gynecology

## 2017-05-07 ENCOUNTER — Ambulatory Visit (INDEPENDENT_AMBULATORY_CARE_PROVIDER_SITE_OTHER): Payer: BLUE CROSS/BLUE SHIELD

## 2017-05-07 ENCOUNTER — Ambulatory Visit (INDEPENDENT_AMBULATORY_CARE_PROVIDER_SITE_OTHER): Payer: BLUE CROSS/BLUE SHIELD | Admitting: Obstetrics & Gynecology

## 2017-05-07 DIAGNOSIS — Z30430 Encounter for insertion of intrauterine contraceptive device: Secondary | ICD-10-CM

## 2017-05-07 NOTE — Patient Instructions (Signed)
1. Encounter for IUD insertion Easy Mirena IUD insertion under US guidance.  Placement confirmed by transvaginal US.  F/U IUD check when back in [place] at Thanksgiving.   Intrauterine Device Insertion, Care After This sheet gives you information about how to care for yourself after your procedure. Your health care provider may also give you more specific instructions. If you have problems or questions, contact your health care provider. What can I expect after the procedure? After the procedure, it is common to have:  Cramps and pain in the abdomen.  Light bleeding (spotting) or heavier bleeding that is like your menstrual period. This may last for up to a few days.  Lower back pain.  Dizziness.  Headaches.  Nausea.  Follow these instructions at home:  Before resuming sexual activity, check to make sure that you can feel the IUD string(s). You should be able to feel the end of the string(s) below the opening of your cervix. If your IUD string is in place, you may resume sexual activity. ? If you had a hormonal IUD inserted more than 7 days after your most recent period started, you will need to use a backup method of birth control for 7 days after IUD insertion. Ask your health care provider whether this applies to you.  Continue to check that the IUD is still in place by feeling for the string(s) after every menstrual period, or once a month.  Take over-the-counter and prescription medicines only as told by your health care provider.  Do not drive or use heavy machinery while taking prescription pain medicine.  Keep all follow-up visits as told by your health care provider. This is important. Contact a health care provider if:  You have bleeding that is heavier or lasts longer than a normal menstrual cycle.  You have a fever.  You have cramps or abdominal pain that get worse or do not get better with medicine.  You develop abdominal pain that is new or is not in the same area  of earlier cramping and pain.  You feel lightheaded or weak.  You have abnormal or bad-smelling discharge from your vagina.  You have pain during sexual activity.  You have any of the following problems with your IUD string(s): ? The string bothers or hurts you or your sexual partner. ? You cannot feel the string. ? The string has gotten longer.  You can feel the IUD in your vagina.  You think you may be pregnant, or you miss your menstrual period.  You think you may have an STI (sexually transmitted infection). Get help right away if:  You have flu-like symptoms.  You have a fever and chills.  You can feel that your IUD has slipped out of place. Summary  After the procedure, it is common to have cramps and pain in the abdomen. It is also common to have light bleeding (spotting) or heavier bleeding that is like your menstrual period.  Continue to check that the IUD is still in place by feeling for the string(s) after every menstrual period, or once a month.  Keep all follow-up visits as told by your health care provider. This is important.  Contact your health care provider if you have problems with your IUD string(s), such as the string getting longer or bothering you or your sexual partner. This information is not intended to replace advice given to you by your health care provider. Make sure you discuss any questions you have with your health care provider.  Document Released: 05/01/2011 Document Revised: 07/24/2016 Document Reviewed: 07/24/2016 Elsevier Interactive Patient Education  2017 [employer].

## 2017-05-07 NOTE — Progress Notes (Signed)
    Michelle Brown [birth year] [id number]        26 y.o.  G0  Passed her [facility] exams.  Moving to [facility] to start with 1st job.  RP:  Mirena IUD insertion  HPI:  On BCPs.  No complaint.  Past medical history,surgical history, problem list, medications, allergies, family history and social history were all reviewed and documented in the EPIC chart.  Directed ROS with pertinent positives and negatives documented in the history of present illness/assessment and plan.  Exam:  There were no vitals filed for this visit. General appearance:  Normal                                                                    IUD procedure note       Patient presented to the office today for placement of Mirena IUD. The patient had previously been provided with literature information on this method of contraception. The risks benefits and pros and cons were discussed and all her questions were answered. She is fully aware that this form of contraception is 99% effective and is good for 5 years.  Pelvic US: Uterus: AV position, normal volume, normal thin endometrium.  Ovaries wnl.  The cervix was cleansed with Betadine solution, Hurricane spray on cervix. The IUD was shown to the patient and inserted in a sterile fashion under US guidance. The IUD string was trimmed. The speculum removed.  A transvaginal US done confirming good IU location of the IUD.  Patient was instructed to return back to the office for follow up IUD check, can only be back around Thanksgiving time.       Assessment/Plan:  26 y.o. G0  1. Encounter for IUD insertion Easy Mirena IUD insertion under US guidance.  Placement confirmed by transvaginal US.  F/U IUD check when back in [place] at Thanksgiving.  Genia Del MD, 3:28 PM 05/07/2017

## 2017-05-12 ENCOUNTER — Encounter: Payer: Self-pay | Admitting: Anesthesiology

## 2017-05-23 ENCOUNTER — Ambulatory Visit (INDEPENDENT_AMBULATORY_CARE_PROVIDER_SITE_OTHER): Payer: BLUE CROSS/BLUE SHIELD | Admitting: Obstetrics & Gynecology

## 2017-05-23 ENCOUNTER — Encounter: Payer: Self-pay | Admitting: Obstetrics & Gynecology

## 2017-05-23 VITALS — BP 124/82

## 2017-05-23 DIAGNOSIS — Z30431 Encounter for routine checking of intrauterine contraceptive device: Secondary | ICD-10-CM | POA: Diagnosis not present

## 2017-05-23 DIAGNOSIS — Z23 Encounter for immunization: Secondary | ICD-10-CM | POA: Diagnosis not present

## 2017-05-23 NOTE — Patient Instructions (Signed)
1. IUD check up IUD well tolerated, in good position, no sign of infection.  F/U Annual/Gyn exam.  2. Flu vaccine need  - Flu Vaccine QUAD 36+ mos IM (Fluarix, Quad PF)   [name], good to see you today!  [facility]

## 2017-05-23 NOTE — Progress Notes (Signed)
    Karinda Mcfadden Apr 01, 1991 [id number]        26 y.o.  [facility]  RP:  IUD check 2+ wks post insertion  HPI:  No pelvic pain.  Minimal vaginal spotting.  No fever.  Past medical history,surgical history, problem list, medications, allergies, family history and social history were all reviewed and documented in the EPIC chart.  Directed ROS with pertinent positives and negatives documented in the history of present illness/assessment and plan.  Exam:  Vitals:   05/23/17 0804  BP: 124/82   General appearance:  Normal  Gyn exam:  Vulva normal                    Speculum:  Cervix normal, no erythema, stings visible.  Vagina normal.  Normal secretions, no blood.  Assessment/Plan:  26 y.o. G0  1. IUD check up IUD well tolerated, in good position, no sign of infection.  F/U Annual/Gyn exam.  2. Flu vaccine need  - Flu Vaccine QUAD 36+ mos IM (Fluarix, Quad PF)  Genia DelMarie-Lyne Husayn Reim MD, 8:21 AM 05/23/2017

## 2017-08-06 ENCOUNTER — Ambulatory Visit: Payer: BLUE CROSS/BLUE SHIELD | Admitting: Obstetrics & Gynecology

## 2018-01-23 ENCOUNTER — Encounter: Payer: Self-pay | Admitting: Family Medicine

## 2018-01-30 ENCOUNTER — Telehealth: Payer: Self-pay | Admitting: *Deleted

## 2018-01-30 NOTE — Telephone Encounter (Signed)
Received Lab Report results from [facility]; forwarded to provider/SLS 05/17
